# Patient Record
Sex: Female | Born: 1935 | Race: White | Hispanic: No | Marital: Married | Smoking: Never smoker
Health system: Southern US, Community
[De-identification: ages and names within clinical notes are randomized; demographics above are authoritative.]

## PROBLEM LIST (undated history)

## (undated) DIAGNOSIS — M1990 Unspecified osteoarthritis, unspecified site: Secondary | ICD-10-CM

## (undated) DIAGNOSIS — K5900 Constipation, unspecified: Secondary | ICD-10-CM

## (undated) DIAGNOSIS — N289 Disorder of kidney and ureter, unspecified: Secondary | ICD-10-CM

## (undated) DIAGNOSIS — F0390 Unspecified dementia without behavioral disturbance: Secondary | ICD-10-CM

## (undated) DIAGNOSIS — R51 Headache: Secondary | ICD-10-CM

## (undated) DIAGNOSIS — R519 Headache, unspecified: Secondary | ICD-10-CM

## (undated) DIAGNOSIS — E559 Vitamin D deficiency, unspecified: Secondary | ICD-10-CM

## (undated) HISTORY — PX: ABDOMINAL HYSTERECTOMY: SHX81

---

## 2010-08-10 ENCOUNTER — Other Ambulatory Visit: Payer: Self-pay | Admitting: Internal Medicine

## 2010-08-10 DIAGNOSIS — Z1231 Encounter for screening mammogram for malignant neoplasm of breast: Secondary | ICD-10-CM

## 2010-08-18 ENCOUNTER — Ambulatory Visit
Admission: RE | Admit: 2010-08-18 | Discharge: 2010-08-18 | Disposition: A | Discharge status: Home / Self Care | Payer: Medicare Other | Source: Ambulatory Visit | Attending: Internal Medicine | Admitting: Internal Medicine

## 2010-08-18 DIAGNOSIS — Z1231 Encounter for screening mammogram for malignant neoplasm of breast: Secondary | ICD-10-CM

## 2011-04-25 ENCOUNTER — Emergency Department (HOSPITAL_COMMUNITY)
Admission: EM | Admit: 2011-04-25 | Discharge: 2011-04-25 | Disposition: A | Discharge status: Home / Self Care | Payer: Medicare Other | Attending: Emergency Medicine | Admitting: Emergency Medicine

## 2011-04-25 ENCOUNTER — Encounter (HOSPITAL_COMMUNITY): Payer: Self-pay | Admitting: *Deleted

## 2011-04-25 DIAGNOSIS — R4182 Altered mental status, unspecified: Secondary | ICD-10-CM | POA: Insufficient documentation

## 2011-04-25 DIAGNOSIS — F0390 Unspecified dementia without behavioral disturbance: Secondary | ICD-10-CM | POA: Insufficient documentation

## 2011-04-25 NOTE — ED Notes (Signed)
Pt in with [employer]. Found at [facility] on [address]. When at triage window, pt gave 2-3 different names, stated her birthday was "[birth year] No ETOH noted, pt denies any meds today. Ambulatory, alert. [employer] reports no Silver Alerts that match pts description at this time. Pt taken to Tr2 due to AMS.

## 2011-04-25 NOTE — ED Provider Notes (Signed)
History     CSN: [id number]  Arrival date & time 04/25/11  1758   First MD Initiated Contact with Patient 04/25/11 1818      Chief Complaint  Patient presents with  . Altered Mental Status    (Consider location/radiation/quality/duration/timing/severity/associated sxs/prior treatment) HPI This patient presents with yet gpd after being found in an office building downtown.  The patient denies any complaints, but is incapable of providing her birthday, today.  Stay, her home, the contact information for any relatives or any useful information.  She denies any pain, notes that she feels fine History reviewed. No pertinent past medical history.  History reviewed. No pertinent past surgical history.  History reviewed. No pertinent family history.  History  Substance Use Topics  . Smoking status: Not on file  . Smokeless tobacco: Not on file  . Alcohol Use: Not on file    OB History    Grav Para Term Preterm Abortions TAB SAB Ect Mult Living                  Review of Systems  Unable to perform ROS: Dementia    Allergies  Review of patient's allergies indicates not on file.  Home Medications  No current outpatient prescriptions on file.  BP 131/69  Pulse 81  Temp(Src) 98.6 F (37 C) (Oral)  Resp 18  SpO2 99%  Physical Exam  Nursing note and vitals reviewed. Constitutional: She appears well-developed and well-nourished. No distress.  HENT:  Head: Normocephalic and atraumatic.  Eyes: Conjunctivae and EOM are normal.  Cardiovascular: Normal rate and regular rhythm.   Pulmonary/Chest: Effort normal and breath sounds normal. No stridor. No respiratory distress.  Abdominal: She exhibits no distension.  Musculoskeletal: She exhibits no edema.  Neurological: She is alert. No cranial nerve deficit. She exhibits normal muscle tone. Gait normal.  Skin: Skin is warm and dry.  Psychiatric: She has a normal mood and affect. Her behavior is normal. Cognition and memory are  impaired. She expresses impulsivity.    ED Course  Procedures (including critical care time)  Labs Reviewed - No data to display No results found.   No diagnosis found.    MDM  This elderly patient presents via the police after being found wandering on exam she is in no distress, is well-appearing, appropriately closed and with seemingly good hygiene.  The patient denies any complaints.  Soon after the patient's arrival, and with the assistance of the police department, the patient's daughter arrived to the emergency department.  The patient's daughter note that the patient is in her usual state of health, has a history of Alzheimer's dementia, and is currently being evaluated for placement at a facility.  Given this history, the patient.  Absence of distress or notable physical exam findings.  She is appropriate for discharge        Gerhard Munch, MD 04/25/11 1858

## 2011-04-25 NOTE — ED Notes (Signed)
MD Jeraldine Loots notified of pt status, MD to bedside

## 2011-04-25 NOTE — ED Notes (Signed)
Pt in with [employer], pt was found downtown, pt walked into an office there, pt is confused, unable to give name, birthday or any identifying information, unknown if this is patients normal mental status- per officer someone at office where patient was found stated that she was dropped off outside. Pt clothing and appearance are appropriate. Bleeding noted to pt right thumb from unknown injury.

## 2011-04-25 NOTE — ED Notes (Signed)
Pt daughter to bedside, verified that patient mental status is normal for her, MD Jeraldine Loots notified and to bedside to meet with family. Daughter states they are working on placement for patient and that they had been looking for patient.

## 2011-05-17 ENCOUNTER — Ambulatory Visit: Payer: Self-pay | Admitting: Family Medicine

## 2011-05-18 ENCOUNTER — Ambulatory Visit (INDEPENDENT_AMBULATORY_CARE_PROVIDER_SITE_OTHER): Payer: Medicare Other | Admitting: Family Medicine

## 2011-05-18 ENCOUNTER — Encounter: Payer: Self-pay | Admitting: Family Medicine

## 2011-05-18 VITALS — BP 100/70 | HR 100 | Temp 98.4°F | Ht 62.5 in | Wt 94.0 lb

## 2011-05-18 DIAGNOSIS — R634 Abnormal weight loss: Secondary | ICD-10-CM

## 2011-05-18 DIAGNOSIS — Z79899 Other long term (current) drug therapy: Secondary | ICD-10-CM

## 2011-05-18 DIAGNOSIS — Z111 Encounter for screening for respiratory tuberculosis: Secondary | ICD-10-CM

## 2011-05-18 DIAGNOSIS — N390 Urinary tract infection, site not specified: Secondary | ICD-10-CM

## 2011-05-18 DIAGNOSIS — R5383 Other fatigue: Secondary | ICD-10-CM

## 2011-05-18 DIAGNOSIS — R05 Cough: Secondary | ICD-10-CM

## 2011-05-18 DIAGNOSIS — F0390 Unspecified dementia without behavioral disturbance: Secondary | ICD-10-CM

## 2011-05-18 LAB — CBC WITH DIFFERENTIAL/PLATELET
Basophils Absolute: 0 10*3/uL (ref 0.0–0.1)
Basophils Relative: 0 % (ref 0–1)
Eosinophils Absolute: 0 10*3/uL (ref 0.0–0.7)
Hemoglobin: 12.6 g/dL (ref 12.0–15.0)
MCH: 34.1 pg — ABNORMAL HIGH (ref 26.0–34.0)
MCHC: 33.2 g/dL (ref 30.0–36.0)
Neutro Abs: 2.7 10*3/uL (ref 1.7–7.7)
Neutrophils Relative %: 67 % (ref 43–77)
Platelets: 297 10*3/uL (ref 150–400)

## 2011-05-18 MED ORDER — SULFAMETHOXAZOLE-TMP DS 800-160 MG PO TABS: 1.0000 | ORAL_TABLET | Freq: Two times a day (BID) | ORAL | Status: AC

## 2011-05-18 NOTE — Patient Instructions (Addendum)
Use one half Ambien per night. Take all the antibiotic and make sure she gets plenty of fluids. Stop taking the fluoxetine. Recheck here in 2 weeks.

## 2011-05-18 NOTE — Progress Notes (Signed)
  Subjective:    Patient ID: Susan Leon, female    DOB: Feb 06, 1933, 78 y.o.   MRN: [id number]  HPI She is here as a new patient for evaluation of possible UTI. She has been having a strong odor in her urine for a month as well as increased confusion. 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 She also apparently has had difficulty with chronic cough and presently is on a new cough suppressant as well as Tessalon. She has an underlying history of headaches and was given Topamax for that. Also apparently over the last 2 or 3 months her husband states that she has lost approximately 30 pounds.. She does have sporadic nausea but no vomiting or change in stool but apparently also has been having difficulty with fatigue. Her medications were reviewed.   Review of Systems     Objective:   Physical Exam Awake and in no distress with a blank stare and very flat affect area she does answer questions but not always correctly. Tympanic membranes and canals are normal. Throat is clear. Tonsils are normal. Neck is supple without adenopathy or thyromegaly. Cardiac exam shows a regular sinus rhythm without murmurs or gallops. Lungs are clear to auscultation. Abdominal exam shows no masses or tenderness. Reflexes are 1+ EKG shows probable IVCD as well as poor R wave progression and nonspecific ST-T changes.    Assessment & Plan:   1. UTI (lower urinary tract infection)  CBC with Differential, CULTURE, URINE COMPREHENSIVE  2. Dementia  CBC with Differential, Comprehensive metabolic panel, Magnesium  3. Encounter for long-term (current) use of other medications  CBC with Differential, Comprehensive metabolic panel, TB Skin Test, PR ELECTROCARDIOGRAM, COMPLETE  4. Chronic cough    5. Weight loss    6   Headache This is a very complicated case and we will need to get old records on her. We will need to pursue the weight loss. This could all be medication related and as such I will  stop her fluoxetine and will have the caregiver reduce Ambien to 5 mg.

## 2011-05-19 LAB — COMPREHENSIVE METABOLIC PANEL
ALT: 13 U/L (ref 0–35)
AST: 20 U/L (ref 0–37)
Albumin: 3.9 g/dL (ref 3.5–5.2)
Alkaline Phosphatase: 78 U/L (ref 39–117)
BUN: 14 mg/dL (ref 6–23)
CO2: 21 mEq/L (ref 19–32)
Calcium: 9.4 mg/dL (ref 8.4–10.5)
Chloride: 106 mEq/L (ref 96–112)
Creat: 1.02 mg/dL (ref 0.50–1.10)
Glucose, Bld: 124 mg/dL — ABNORMAL HIGH (ref 70–99)
Potassium: 3.6 mEq/L (ref 3.5–5.3)
Sodium: 142 mEq/L (ref 135–145)
Total Bilirubin: 0.4 mg/dL (ref 0.3–1.2)
Total Protein: 6.1 g/dL (ref 6.0–8.3)

## 2011-05-21 ENCOUNTER — Ambulatory Visit (INDEPENDENT_AMBULATORY_CARE_PROVIDER_SITE_OTHER): Payer: Medicare Other | Admitting: Family Medicine

## 2011-05-21 DIAGNOSIS — R05 Cough: Secondary | ICD-10-CM

## 2011-05-21 DIAGNOSIS — R634 Abnormal weight loss: Secondary | ICD-10-CM | POA: Insufficient documentation

## 2011-05-21 LAB — CULTURE, URINE COMPREHENSIVE: Colony Count: >=100000

## 2011-05-21 NOTE — Progress Notes (Signed)
  Subjective:    Patient ID: Susan Leon, female    DOB: January 18, 1933, 78 y.o.   MRN: [id number]  HPI She is here for a followup visit and to have the FL2 form filled out. Her PPD skin test his negative. She is here with her husband and caregiver. Recent blood work was entirely negative. She apparently has had a rather significant weight loss in the last several months. The urine the process of getting her admitted to 8 memory care unit.   Review of Systems     Objective:   Physical Exam Alert and in no distress but oriented to person. She has a rather blank stare.       Assessment & Plan:   1. Chronic cough   2. Weight loss   3   dementia Refer to GI to further evaluate her weight loss. Suspect this could be medication related but would like the input from GI.

## 2011-05-23 ENCOUNTER — Ambulatory Visit (INDEPENDENT_AMBULATORY_CARE_PROVIDER_SITE_OTHER): Payer: Medicare Other | Admitting: Family Medicine

## 2011-05-23 ENCOUNTER — Encounter: Payer: Self-pay | Admitting: Family Medicine

## 2011-05-23 VITALS — BP 100/70 | HR 60 | Wt 90.0 lb

## 2011-05-23 DIAGNOSIS — S0993XA Unspecified injury of face, initial encounter: Secondary | ICD-10-CM

## 2011-05-23 DIAGNOSIS — S20219A Contusion of unspecified front wall of thorax, initial encounter: Secondary | ICD-10-CM

## 2011-05-23 NOTE — Patient Instructions (Signed)
Use Tylenol for aches and pains. Call if further trouble

## 2011-05-23 NOTE — Progress Notes (Signed)
  Subjective:    Patient ID: Susan Leon, female    DOB: 1932-09-08, 78 y.o.   MRN: [id number]  HPI She apparently fell yesterday injuring her right orbital area as well as complaining of some left rib pain. She is having no shortness of breath, abdominal pain, nausea, vomiting, change in orientation.  Review of Systems     Objective:   Physical Exam Alert and more responsive than yesterday. Ecchymosis noted to the right lateral eye area. Cornea and conjunctiva are normal. EOMI. No real oozing is noted. Abdominal exam shows no masses or tenderness. She has slight left lower lateral rib tenderness. Lungs clear to auscultation.       Assessment & Plan:   1. Rib contusion   2. Facial trauma    complained to them that I did not think she had any major going on. Discussed the issue of rib fracture in regard to lung damage and possible splenic problems. She is having no difficulty with either of these. I will treat her symptomatically.

## 2011-05-29 ENCOUNTER — Telehealth: Payer: Self-pay | Admitting: Internal Medicine

## 2011-05-29 NOTE — Telephone Encounter (Signed)
What we sent was a list of med's so I believe they just need hard copy's faxed to them and Ambien is 5 mg in chart and papers we faxed [facility]

## 2011-05-29 NOTE — Telephone Encounter (Signed)
[facility] needs a hard script for ativan and ambien. daughter also said something about you were going to change her ambien dose. Susan Leon has lost alot of weight and [name] has been trying to give her boost. If that is okay they need a written order for boost or you can write a prescription for remoron.

## 2011-05-29 NOTE — Telephone Encounter (Signed)
Check with the nursing home on these meds and make sure the Ambien is 5 mg

## 2011-05-30 NOTE — Telephone Encounter (Signed)
Faxed to company

## 2011-06-01 ENCOUNTER — Other Ambulatory Visit: Payer: Self-pay | Admitting: Family Medicine

## 2011-06-04 ENCOUNTER — Ambulatory Visit: Payer: Medicare Other | Admitting: Gastroenterology

## 2011-06-11 ENCOUNTER — Other Ambulatory Visit: Payer: Self-pay

## 2011-06-11 ENCOUNTER — Encounter (HOSPITAL_COMMUNITY): Payer: Self-pay | Admitting: Physical Medicine and Rehabilitation

## 2011-06-11 ENCOUNTER — Emergency Department (HOSPITAL_COMMUNITY): Payer: Medicare Other

## 2011-06-11 ENCOUNTER — Emergency Department (HOSPITAL_COMMUNITY)
Admission: EM | Admit: 2011-06-11 | Discharge: 2011-06-11 | Disposition: A | Discharge status: Home Health | Payer: Medicare Other | Attending: Emergency Medicine | Admitting: Emergency Medicine

## 2011-06-11 DIAGNOSIS — Z043 Encounter for examination and observation following other accident: Secondary | ICD-10-CM | POA: Insufficient documentation

## 2011-06-11 DIAGNOSIS — W19XXXA Unspecified fall, initial encounter: Secondary | ICD-10-CM | POA: Insufficient documentation

## 2011-06-11 DIAGNOSIS — N949 Unspecified condition associated with female genital organs and menstrual cycle: Secondary | ICD-10-CM | POA: Insufficient documentation

## 2011-06-11 DIAGNOSIS — F0390 Unspecified dementia without behavioral disturbance: Secondary | ICD-10-CM | POA: Insufficient documentation

## 2011-06-11 DIAGNOSIS — Z79899 Other long term (current) drug therapy: Secondary | ICD-10-CM | POA: Insufficient documentation

## 2011-06-11 DIAGNOSIS — R10819 Abdominal tenderness, unspecified site: Secondary | ICD-10-CM | POA: Insufficient documentation

## 2011-06-11 DIAGNOSIS — R079 Chest pain, unspecified: Secondary | ICD-10-CM | POA: Insufficient documentation

## 2011-06-11 HISTORY — DX: Unspecified dementia, unspecified severity, without behavioral disturbance, psychotic disturbance, mood disturbance, and anxiety: F03.90

## 2011-06-11 LAB — DIFFERENTIAL
Basophils Absolute: 0 10*3/uL (ref 0.0–0.1)
Basophils Relative: 0 % (ref 0–1)
Eosinophils Relative: 2 % (ref 0–5)
Monocytes Absolute: 0.6 10*3/uL (ref 0.1–1.0)
Neutro Abs: 2.4 10*3/uL (ref 1.7–7.7)

## 2011-06-11 LAB — URINALYSIS, ROUTINE W REFLEX MICROSCOPIC
Bilirubin Urine: NEGATIVE
Hgb urine dipstick: NEGATIVE
Protein, ur: NEGATIVE mg/dL
Urobilinogen, UA: 1 mg/dL (ref 0.0–1.0)

## 2011-06-11 LAB — URINE MICROSCOPIC-ADD ON

## 2011-06-11 LAB — COMPREHENSIVE METABOLIC PANEL
Alkaline Phosphatase: 157 U/L — ABNORMAL HIGH (ref 39–117)
BUN: 17 mg/dL (ref 6–23)
Calcium: 8.9 mg/dL (ref 8.4–10.5)
GFR calc Af Amer: 72 mL/min — ABNORMAL LOW (ref >90)
Glucose, Bld: 94 mg/dL (ref 70–99)
Total Protein: 6.5 g/dL (ref 6.0–8.3)

## 2011-06-11 LAB — CBC
HCT: 35.1 % — ABNORMAL LOW (ref 36.0–46.0)
MCHC: 33.9 g/dL (ref 30.0–36.0)
MCV: 101.2 fL — ABNORMAL HIGH (ref 78.0–100.0)
RDW: 13.5 % (ref 11.5–15.5)

## 2011-06-11 MED ORDER — IOHEXOL 300 MG/ML  SOLN
80.0000 mL | Freq: Once | INTRAMUSCULAR | Status: AC | PRN
  Administered 2011-06-11: 80 mL via INTRAVENOUS

## 2011-06-11 NOTE — ED Provider Notes (Signed)
History     CSN: [id number]  Arrival date & time 06/11/11  1658   First MD Initiated Contact with Patient 06/11/11 1659      Chief Complaint  Patient presents with  . Fall    (Consider location/radiation/quality/duration/timing/severity/associated sxs/prior treatment) Patient is a 78 y.o. female presenting with fall. The history is provided by the nursing home.  Fall The accident occurred 1 to 2 hours ago. The fall occurred while walking. Distance fallen: from standing. She landed on a hard floor. There was no blood loss. Point of impact: unknown. patient denies pain.  Pain location: patient denies. The patient is experiencing no pain. She was ambulatory at the scene. There was no entrapment after the fall. There was no alcohol use involved in the accident. Pertinent negatives include no fever, no nausea and no vomiting. Associated symptoms comments: Unclear if LOC. Was found conscious. ROS per nursing facility staff. . Exacerbated by: nothing. Treatment on scene includes a c-collar and a backboard. She has tried nothing for the symptoms.    Past Medical History  Diagnosis Date  . Dementia     History reviewed. No pertinent past surgical history.  History reviewed. No pertinent family history.  History  Substance Use Topics  . Smoking status: Never Smoker   . Smokeless tobacco: Never Used  . Alcohol Use: No    OB History    Grav Para Term Preterm Abortions TAB SAB Ect Mult Living                  Review of Systems  Unable to perform ROS: Dementia  Constitutional: Negative for fever.  Gastrointestinal: Negative for nausea and vomiting.    Allergies  Review of patient's allergies indicates no known allergies.  Home Medications   Current Outpatient Rx  Name Route Sig Dispense Refill  . BENZONATATE 200 MG PO CAPS Oral Take 200 mg by mouth 2 (two) times daily as needed.    Marland Kitchen DEXTROMETHORPHAN-QUINIDINE 20-10 MG PO CAPS Oral Take 1 capsule by mouth.    . FLUOXETINE  HCL 20 MG PO TABS Oral Take 20 mg by mouth daily.    . TOPIRAMATE 100 MG PO TABS Oral Take 100 mg by mouth 2 (two) times daily.    Marland Kitchen ZOLPIDEM TARTRATE 10 MG PO TABS Oral Take 10 mg by mouth at bedtime as needed.      BP 117/53  Pulse 80  Temp(Src) 98.6 F (37 C) (Oral)  Resp 18  SpO2 98%  Physical Exam  Constitutional: She appears well-developed. No distress.       Elderly, thin, frail   HENT:  Head: Normocephalic and atraumatic.  Mouth/Throat: Oropharynx is clear and moist.  Eyes: EOM are normal. Pupils are equal, round, and reactive to light.  Neck:       No TTP of midline cervical spine.   Cardiovascular: Normal rate, regular rhythm and normal heart sounds.  Exam reveals no friction rub.   No murmur heard. Pulmonary/Chest: Effort normal and breath sounds normal. No respiratory distress. She has no wheezes. She has no rales.  Abdominal: Soft. There is tenderness. There is guarding. There is no rebound.       Patient appears to have a mild amount of involuntary guarding on examination of mid and lower abdomen.   Musculoskeletal: Normal range of motion. She exhibits tenderness. She exhibits no edema.       No TTP or signs of trauma over chest. TTP over pubic symphisis. Full passive ROM  of both hips with flex/ext, abd/adduction without pain or limitation.   Lymphadenopathy:    She has no cervical adenopathy.  Neurological: She is alert.       Oriented to person. Follows simple commands. Gives inappropriate answers to questions. Moves all extremities spontaneously.   Skin: Skin is warm and dry. No rash noted.  Psychiatric: She has a normal mood and affect. Her behavior is normal.    ED Course  Procedures (including critical care time)  Results for orders placed during the hospital encounter of 06/11/11  CBC      Component Value Range   WBC 5.0  4.0 - 10.5 (K/uL)   RBC 3.47 (*) 3.87 - 5.11 (MIL/uL)   Hemoglobin 11.9 (*) 12.0 - 15.0 (g/dL)   HCT 40.9 (*) 81.1 - 46.0 (%)    MCV 101.2 (*) 78.0 - 100.0 (fL)   MCH 34.3 (*) 26.0 - 34.0 (pg)   MCHC 33.9  30.0 - 36.0 (g/dL)   RDW 91.4  78.2 - 95.6 (%)   Platelets 277  150 - 400 (K/uL)  DIFFERENTIAL      Component Value Range   Neutrophils Relative 48  43 - 77 (%)   Neutro Abs 2.4  1.7 - 7.7 (K/uL)   Lymphocytes Relative 38  12 - 46 (%)   Lymphs Abs 1.9  0.7 - 4.0 (K/uL)   Monocytes Relative 12  3 - 12 (%)   Monocytes Absolute 0.6  0.1 - 1.0 (K/uL)   Eosinophils Relative 2  0 - 5 (%)   Eosinophils Absolute 0.1  0.0 - 0.7 (K/uL)   Basophils Relative 0  0 - 1 (%)   Basophils Absolute 0.0  0.0 - 0.1 (K/uL)  TROPONIN I      Component Value Range   Troponin I <0.30  <0.30 (ng/mL)  URINALYSIS, ROUTINE W REFLEX MICROSCOPIC      Component Value Range   Color, Urine YELLOW  YELLOW    APPearance HAZY (*) CLEAR    Specific Gravity, Urine 1.016  1.005 - 1.030    pH 7.5  5.0 - 8.0    Glucose, UA NEGATIVE  NEGATIVE (mg/dL)   Hgb urine dipstick NEGATIVE  NEGATIVE    Bilirubin Urine NEGATIVE  NEGATIVE    Ketones, ur NEGATIVE  NEGATIVE (mg/dL)   Protein, ur NEGATIVE  NEGATIVE (mg/dL)   Urobilinogen, UA 1.0  0.0 - 1.0 (mg/dL)   Nitrite NEGATIVE  NEGATIVE    Leukocytes, UA SMALL (*) NEGATIVE   COMPREHENSIVE METABOLIC PANEL      Component Value Range   Sodium 138  135 - 145 (mEq/L)   Potassium 3.4 (*) 3.5 - 5.1 (mEq/L)   Chloride 106  96 - 112 (mEq/L)   CO2 22  19 - 32 (mEq/L)   Glucose, Bld 94  70 - 99 (mg/dL)   BUN 17  6 - 23 (mg/dL)   Creatinine, Ser 2.13  0.50 - 1.10 (mg/dL)   Calcium 8.9  8.4 - 08.6 (mg/dL)   Total Protein 6.5  6.0 - 8.3 (g/dL)   Albumin 3.6  3.5 - 5.2 (g/dL)   AST 19  0 - 37 (U/L)   ALT 13  0 - 35 (U/L)   Alkaline Phosphatase 157 (*) 39 - 117 (U/L)   Total Bilirubin 0.2 (*) 0.3 - 1.2 (mg/dL)   GFR calc non Af Amer 62 (*) >90 (mL/min)   GFR calc Af Amer 72 (*) >90 (mL/min)  URINE MICROSCOPIC-ADD ON  Component Value Range   Squamous Epithelial / LPF FEW (*) RARE    WBC, UA 0-2  <3  (WBC/hpf)   Bacteria, UA FEW (*) RARE    Urine-Other AMORPHOUS URATES/PHOSPHATES      Dg Chest 2 View  06/11/2011  *RADIOLOGY REPORT*  Clinical Data: Chest pain  CHEST - 2 VIEW  Comparison: None.  Findings: Cardiomediastinal silhouette is unremarkable.  No acute infiltrate or pleural effusion.  No pulmonary edema.  There is moderate compression deformity mid lumbar spine of indeterminate age.  Clinical correlation is necessary.  IMPRESSION: No acute infiltrate or pleural effusion.  No pulmonary edema. There is moderate compression deformity mid lumbar spine of indeterminate age.  Clinical correlation is necessary.  Original Report Authenticated By: Natasha Mead, M.D.   Dg Pelvis 1-2 Views  06/11/2011  *RADIOLOGY REPORT*  Clinical Data: Fall, pain  PELVIS - 1-2 VIEW  Comparison: None.  Findings: Pessary noted.  Bones appear osteopenic.  Curvature of the spine may be positional or scoliosis.  Sacrum, pelvis and hips appear intact and symmetric.  No diastasis.  IMPRESSION: No acute osseous finding by plain radiography  Original Report Authenticated By: Judie Petit. Ruel Favors, M.D.   Ct Head Wo Contrast  06/11/2011  *RADIOLOGY REPORT*  Clinical Data: Fall  CT HEAD WITHOUT CONTRAST,CT CERVICAL SPINE WITHOUT CONTRAST  Technique:  Contiguous axial images were obtained from the base of the skull through the vertex without contrast.,Technique: Multidetector CT imaging of the cervical spine was performed. Multiplanar CT image reconstructions were also generated.  Comparison: None.  Findings: No skull fracture is noted.  There is mucosal thickening bilateral sphenoid sinus.  Mastoid air cells are unremarkable. Mild cerebral atrophy.  No intracranial hemorrhage, mass effect or midline shift. Periventricular and patchy subcortical white matter decreased attenuation is probable due to chronic small vessel ischemic changes.  No definite acute cortical infarction.  No mass lesion is noted on this unenhanced scan.  IMPRESSION: No  acute intracranial abnormality. Mild cerebral atrophy. Periventricular and patchy subcortical white matter decreased attenuation is probable due to chronic small vessel ischemic changes.  CT cervical spine without IV contrast findings:  Axial images of the cervical spine shows no acute fracture or subluxation.  Computer processed images shows no acute fracture. There is mild about 2 mm anterior subluxation C3 on C4 vertebral body.  This is probable chronic in nature.  Mild anterior spurring noted at C3-C4 C4-C5 C5-C6 level.  There is mild disc space flattening at C3-C4 C4-C5 and C5-C6 level.  Mild disc space flattening with posterior spurring noted at C6-C7 level.  No prevertebral soft tissue swelling.  Mild posterior spurring noted at C4-C5 and C5-C6 level.  Degenerative changes are noted C1-C2 articulation.  Cervical airway is patent.  Impression: 1.  No acute fracture.  Probable chronic mild anterior subluxation C3 on C4 vertebral body.  Multilevel degenerative changes most significant at C3-C4 C4-C5 and C5-C6 level.  No prevertebral soft tissue swelling.  Original Report Authenticated By: Natasha Mead, M.D.   Ct Cervical Spine Wo Contrast  06/11/2011  *RADIOLOGY REPORT*  Clinical Data: Fall  CT HEAD WITHOUT CONTRAST,CT CERVICAL SPINE WITHOUT CONTRAST  Technique:  Contiguous axial images were obtained from the base of the skull through the vertex without contrast.,Technique: Multidetector CT imaging of the cervical spine was performed. Multiplanar CT image reconstructions were also generated.  Comparison: None.  Findings: No skull fracture is noted.  There is mucosal thickening bilateral sphenoid sinus.  Mastoid air cells are unremarkable. Mild  cerebral atrophy.  No intracranial hemorrhage, mass effect or midline shift. Periventricular and patchy subcortical white matter decreased attenuation is probable due to chronic small vessel ischemic changes.  No definite acute cortical infarction.  No mass lesion is noted  on this unenhanced scan.  IMPRESSION: No acute intracranial abnormality. Mild cerebral atrophy. Periventricular and patchy subcortical white matter decreased attenuation is probable due to chronic small vessel ischemic changes.  CT cervical spine without IV contrast findings:  Axial images of the cervical spine shows no acute fracture or subluxation.  Computer processed images shows no acute fracture. There is mild about 2 mm anterior subluxation C3 on C4 vertebral body.  This is probable chronic in nature.  Mild anterior spurring noted at C3-C4 C4-C5 C5-C6 level.  There is mild disc space flattening at C3-C4 C4-C5 and C5-C6 level.  Mild disc space flattening with posterior spurring noted at C6-C7 level.  No prevertebral soft tissue swelling.  Mild posterior spurring noted at C4-C5 and C5-C6 level.  Degenerative changes are noted C1-C2 articulation.  Cervical airway is patent.  Impression: 1.  No acute fracture.  Probable chronic mild anterior subluxation C3 on C4 vertebral body.  Multilevel degenerative changes most significant at C3-C4 C4-C5 and C5-C6 level.  No prevertebral soft tissue swelling.  Original Report Authenticated By: Natasha Mead, M.D.   Ct Abdomen Pelvis W Contrast  06/11/2011  *RADIOLOGY REPORT*  Clinical Data: Fall  CT ABDOMEN AND PELVIS WITH CONTRAST  Technique:  Multidetector CT imaging of the abdomen and pelvis was performed following the standard protocol during bolus administration of intravenous contrast.  Contrast: 80mL OMNIPAQUE IOHEXOL 300 MG/ML IJ SOLN  Comparison: 06/11/2011 radiograph  Findings: Respiratory motion degrades evaluation of the lung bases. Limited images through the lung bases demonstrate no significant appreciable abnormality. The heart size is within normal limits. No pleural or pericardial effusion.  Unremarkable liver status post cholecystectomy.  Mild prominence of the CBD, measuring up to 8 mm.  Tapers smoothly to the level of the ampulla.  Unremarkable spleen,  pancreas, adrenal glands. Duplicated collecting system on the left.  Otherwise symmetric renal enhancement.  No bowel obstruction.  No CT evidence for colitis.  Appendix not identified however no right lower quadrant inflammation.  Pelvic floor laxity.  Pessary in place.  Absent uterus.  No adnexal mass. Decompressed bladder.  Normal caliber vasculature.  Mild atherosclerotic disease.  Osteopenia and multilevel degenerative change.  There is rightward curvature of the thoracolumbar spine.  L1 compression fracture with mild narrowing of the central canal secondary to retropulsion.  IMPRESSION: No acute intra-abdominal process identified.  L1 compression fracture with 50% height loss and mild central canal narrowing secondary to retropulsion.  Correlate with point tenderness.  Original Report Authenticated By: Waneta Martins, M.D.     1. Fall       MDM  73:38 PM 78 year old female with a history of dementia presenting from her skilled nursing facility with a fall. Nursing states that she was found down in her room and the fall was unwitnessed. She was seen normal roughly 15 minutes prior to the fall. Nursing staff state initially she would not respond to them, but when EMS arrived she would respond to them. Prior to the fall she had been at her baseline which is oriented to person, but otherwise confused. She's had no recent fever, cough, vomiting, abdominal pain or diarrhea. Patient is oriented to person but is otherwise confused and gives inappropriate responses which appears to be her baseline. She  has mild tenderness of the pubic symphysis and appears to be mildly tender abdominal exam. Will check CBC, BMP, urinalysis, CT of the head and cervical spine with plain film of the chest and pelvis. We'll also check CT of the abdomen and pelvis given abdominal tenderness on exam.  Labs are without significant abnormality. Urinalysis is clean. CT head shows chronic change but is otherwise unremarkable.  Remainder of imaging is negative for trauma. CT of the abdomen and pelvis reveals an absent gallbladder but a mildly dilated common bile duct. This is likely incidental and of no significance. The husband was made aware to make sure this is followed up given her alkaline phosphatase was mildly elevated. Patient was noted to ambulate without difficulty. Given that imaging is negative for trauma, labs without significant abnormality and she is at her baseline mental and physical functioning there is no indication for admission at this point. Plan was discussed with the husband and she will follow up with her primary physician for reevaluation. He was understanding and agreement. Patient and husband were given strong return precautions and discharged home in stable condition.      Sheran Luz, MD 06/12/11 [id number]

## 2011-06-11 NOTE — ED Notes (Signed)
Called CT b/c pt has not rcvd contrast.

## 2011-06-11 NOTE — ED Notes (Signed)
Pt presents to department via EMS from [facility]. Facility staff found her lying on the floor. Pt awake and alert per normal neuro baseline, has dementia. R leg rotation and tenderness noted. No obvious deformity noted. CBG 84. 20g LAC.

## 2011-06-11 NOTE — ED Provider Notes (Signed)
I saw and evaluated the patient, reviewed the resident's note and I agree with the findings and plan.  The patient has significant dementia. She was found on the ground at her living facility but is unable to tell us what occurred..  during my exam she denies any particular complaints. She is asking to get up and go to the bathroom.   On my exam there is no area significant tenderness. She is able to ambulate around the emergency department. We will assess for any injuries or any acute medical conditions that might predispose towards a syncopal event  Celene Kras, MD 06/11/11 [phone]

## 2011-06-11 NOTE — ED Notes (Signed)
Pt returned to exam room from x-ray. In and Out cath performed to obtain urine sample. Pt tolerated without difficulty. Husband at the bedside. Vital signs stable. Pt is alert, but remains confused.

## 2011-06-11 NOTE — ED Notes (Signed)
Pt presents to department for evaluation of fall. Was found lying in the floor by staff members. Pt has dementia and is unable to recall events. She is alert, but unable to answer questions appropriately. No obvious deformities noted. No abrasion or bruising noted. Able to move all extremities. No rotation noted to legs. Removed from LSB by Dr. Fayrene Fearing. c-collar remains in place.

## 2011-06-11 NOTE — ED Notes (Signed)
Patient transported to CT 

## 2011-07-20 ENCOUNTER — Ambulatory Visit (INDEPENDENT_AMBULATORY_CARE_PROVIDER_SITE_OTHER): Payer: Medicare Other | Admitting: Family Medicine

## 2011-07-20 ENCOUNTER — Encounter: Payer: Self-pay | Admitting: Family Medicine

## 2011-07-20 VITALS — BP 130/70 | HR 83 | Wt 91.0 lb

## 2011-07-20 DIAGNOSIS — F0390 Unspecified dementia without behavioral disturbance: Secondary | ICD-10-CM

## 2011-07-20 DIAGNOSIS — R358 Other polyuria: Secondary | ICD-10-CM

## 2011-07-20 LAB — POCT URINALYSIS DIPSTICK
Bilirubin, UA: NEGATIVE
Nitrite, UA: NEGATIVE
pH, UA: 5

## 2011-07-20 MED ORDER — PAROXETINE HCL 10 MG PO TABS: 10.0000 mg | ORAL_TABLET | ORAL | Status: DC

## 2011-07-20 NOTE — Progress Notes (Signed)
  Subjective:    Patient ID: Susan Leon, female    DOB: [birth year], 78 y.o.   MRN: [id number]  HPI She is here for an evaluation. She is not sure why she is here. She does talk but does not answer questions appropriately. Her husband is with her. Apparently there was some question about bladder infection from the care unit she is staying in. There is also question concerning her emotional lability.   Review of Systems     Objective:   Physical Exam Alert and in no distress but definitely not oriented. Urine microscopic was equivocal.       Assessment & Plan:   1. Polyuria  POCT Urinalysis Dipstick, CULTURE, URINE COMPREHENSIVE  2. Dementia     I will check the culture concerning her urine. Also stop Prozac and switch her to Paxil to see if that will help. Check here in one month.

## 2011-07-23 ENCOUNTER — Other Ambulatory Visit: Payer: Self-pay

## 2011-07-23 LAB — CULTURE, URINE COMPREHENSIVE

## 2011-07-23 MED ORDER — SULFAMETHOXAZOLE-TRIMETHOPRIM 400-80 MG PO TABS: 1.0000 | ORAL_TABLET | Freq: Two times a day (BID) | ORAL | Status: DC

## 2011-07-23 NOTE — Telephone Encounter (Signed)
Sent med in per jcl 

## 2011-08-01 ENCOUNTER — Encounter: Payer: Self-pay | Admitting: Family Medicine

## 2011-08-01 ENCOUNTER — Ambulatory Visit (INDEPENDENT_AMBULATORY_CARE_PROVIDER_SITE_OTHER): Payer: Medicare Other | Admitting: Family Medicine

## 2011-08-01 ENCOUNTER — Encounter: Payer: Self-pay | Admitting: Gastroenterology

## 2011-08-01 VITALS — BP 110/60 | HR 93 | Wt 85.0 lb

## 2011-08-01 DIAGNOSIS — R634 Abnormal weight loss: Secondary | ICD-10-CM

## 2011-08-01 DIAGNOSIS — F0390 Unspecified dementia without behavioral disturbance: Secondary | ICD-10-CM

## 2011-08-01 DIAGNOSIS — R627 Adult failure to thrive: Secondary | ICD-10-CM

## 2011-08-01 DIAGNOSIS — R319 Hematuria, unspecified: Secondary | ICD-10-CM

## 2011-08-01 LAB — POCT URINALYSIS DIPSTICK
Bilirubin, UA: NEGATIVE
Glucose, UA: NEGATIVE
Nitrite, UA: NEGATIVE

## 2011-08-01 MED ORDER — PAROXETINE HCL 20 MG PO TABS: 20.0000 mg | ORAL_TABLET | ORAL | Status: DC

## 2011-08-01 NOTE — Progress Notes (Signed)
  Subjective:    Patient ID: Susan Leon, female    DOB: 05-Jun-1932, 78 y.o.   MRN: [id number]  HPI She is here for recheck on her urine. Apparently she is having no urinary symptoms but did have some stool incontinence last night. Review her record indicates she continues to lose weight. She was set up for a GI evaluation however her husband had to cancel it due to her anxiety. She continues to have difficulty with anxiety and has not yet responded to Paxil 10 mg.  Review of Systems     Objective:   Physical Exam Alert and in no distress. She responds to questions inappropriately, giving a response unrelated to the question. Abdominal exam shows no masses or tenderness. Review of her weight indicates about a 10 pound weight loss.     Assessment & Plan:   1. Blood in urine  POCT Urinalysis Dipstick  2. Failure to thrive in adult  Ambulatory referral to Gastroenterology  3. Weight loss  Ambulatory referral to Gastroenterology  4. Dementia  Ambulatory referral to Gastroenterology   I suspect her weight loss is probably more dementia and failure to thrive but will need to make sure there is no GI etiology.

## 2011-08-06 ENCOUNTER — Other Ambulatory Visit: Payer: Medicare Other

## 2011-08-07 ENCOUNTER — Telehealth: Payer: Self-pay

## 2011-08-07 NOTE — Telephone Encounter (Signed)
Open in error

## 2011-08-09 ENCOUNTER — Telehealth: Payer: Self-pay | Admitting: Family Medicine

## 2011-08-10 ENCOUNTER — Encounter: Payer: Self-pay | Admitting: Gastroenterology

## 2011-08-10 ENCOUNTER — Ambulatory Visit (INDEPENDENT_AMBULATORY_CARE_PROVIDER_SITE_OTHER): Payer: Medicare Other | Admitting: Gastroenterology

## 2011-08-10 ENCOUNTER — Other Ambulatory Visit (INDEPENDENT_AMBULATORY_CARE_PROVIDER_SITE_OTHER): Payer: Medicare Other

## 2011-08-10 VITALS — BP 120/70 | HR 80 | Ht 63.0 in | Wt 88.4 lb

## 2011-08-10 DIAGNOSIS — R6889 Other general symptoms and signs: Secondary | ICD-10-CM

## 2011-08-10 DIAGNOSIS — F0280 Dementia in other diseases classified elsewhere without behavioral disturbance: Secondary | ICD-10-CM

## 2011-08-10 DIAGNOSIS — D509 Iron deficiency anemia, unspecified: Secondary | ICD-10-CM

## 2011-08-10 DIAGNOSIS — F068 Other specified mental disorders due to known physiological condition: Secondary | ICD-10-CM

## 2011-08-10 DIAGNOSIS — K5902 Outlet dysfunction constipation: Secondary | ICD-10-CM

## 2011-08-10 DIAGNOSIS — Z9049 Acquired absence of other specified parts of digestive tract: Secondary | ICD-10-CM

## 2011-08-10 DIAGNOSIS — Z9089 Acquired absence of other organs: Secondary | ICD-10-CM

## 2011-08-10 DIAGNOSIS — N390 Urinary tract infection, site not specified: Secondary | ICD-10-CM

## 2011-08-10 DIAGNOSIS — R634 Abnormal weight loss: Secondary | ICD-10-CM

## 2011-08-10 DIAGNOSIS — G309 Alzheimer's disease, unspecified: Secondary | ICD-10-CM

## 2011-08-10 LAB — CBC WITH DIFFERENTIAL/PLATELET
Basophils Absolute: 0 10*3/uL (ref 0.0–0.1)
Basophils Relative: 0.5 % (ref 0.0–3.0)
Eosinophils Absolute: 0 10*3/uL (ref 0.0–0.7)
Lymphocytes Relative: 32.1 % (ref 12.0–46.0)
MCHC: 32.5 g/dL (ref 30.0–36.0)
Neutrophils Relative %: 58.7 % (ref 43.0–77.0)
Platelets: 239 10*3/uL (ref 150.0–400.0)
RBC: 3.37 Mil/uL — ABNORMAL LOW (ref 3.87–5.11)

## 2011-08-10 LAB — FERRITIN: Ferritin: 69.5 ng/mL (ref 10.0–291.0)

## 2011-08-10 LAB — MAGNESIUM: Magnesium: 2.1 mg/dL (ref 1.5–2.5)

## 2011-08-10 LAB — IBC PANEL: Saturation Ratios: 60.8 % — ABNORMAL HIGH (ref 20.0–50.0)

## 2011-08-10 LAB — FOLATE: Folate: 13.2 ng/mL (ref >5.9)

## 2011-08-10 LAB — VITAMIN B12: Vitamin B-12: 301 pg/mL (ref 211–911)

## 2011-08-10 MED ORDER — POLYETHYLENE GLYCOL 3350 17 GM/SCOOP PO POWD: ORAL | Status: DC

## 2011-08-10 NOTE — Progress Notes (Signed)
History of Present Illness:  This is a very nice but unfortunate 78 year old Caucasian female with advancing Alzheimer's disease requiring institutionalization. She is brought to our office today by her husband for evaluation of progressive weight loss over the last 2 years. The patient can really give no reliable history. Apparently she has no appetite, but both patient and the husband denied dysphagia, nausea vomiting, epigastric pain, or any hepatobiliary complaints. There was a question of possible dark melanotic stools, but review of her labs shows no significant anemia, and she had CT scan of the abdomen in March which was entirely normal. Apparently she does have some incontinency of stool and urine. She denies lower bowel pain, and there is no history of fever, chills, or systemic complaints. Patient had colonoscopy in [place] in 2008 which was apparently normal. She has marked agoraphobia and her husband does not think she is a candidate for any procedures at this time. Family history is noncontributory. Review of her labs shows no NSAIDs, and she does not use alcohol or cigarettes. For headache she is on Topamax 100 mg a day, also Prozac 20 mg a day, Bactrim for recurrent urinary tract infections, and dextromethorphan-quinidine 20-10 one tablet a day. She apparently has a chronic cough and uses when necessary Tessalon Perles. Patient is status post cholecystectomy. CT scan showed no evidence of pancreatic mass or dilated biliary structures. She does have a chronic compression fracture of L1.  I have reviewed this patient's present history, medical and surgical past history, allergies and medications.     ROS: The remainder of the 10 point ROS is negative.. recurrent headaches that have been evaluated at the headache Center, mild insomnia, diagnosed previously associated depression, chronic back pain, and urinary incontinency and recurrent urinary tract infections. Despite being on multiple  antibiotics, she's had no symptoms of C. difficile infection. Patient and her husband deny any specific food intolerances.    Physical Exam: Depressed patient with obvious severe memory dysfunction. She is awake and communicative. Blood pressure 110/80, pulse 76 and regular, and weight 94 pounds. General very thin almost cachectic-appearing patient in no acute distress, appearing oldeer her stated age Eyes PERRLA, no icterus, fundoscopic exam per opthamologist Skin no lesions noted Neck supple, no adenopathy, no thyroid enlargement, no tenderness Chest clear to percussion and auscultation Heart no significant murmurs, gallops or rubs noted Abdomen no hepatosplenomegaly masses or tenderness, BS normal.  Rectal inspection normal no fissures, or fistulae noted.  No masses or tenderness on digital exam. Stool guaiac negative. Impaction of very hard stool in the rectal vault which is guaiac negative. Extremities no acute joint lesions, edema, phlebitis or evidence of cellulitis. Psychological mental status normal and normal affect.  Assessment and plan: Advancing dementia probably a candidate for this patient's lack of appetite and weight loss. I have reviewed her labs and CT scan, and really have no great ideas at this point . Ideally, endoscopy would be can schedule, but apparently she is not a candidate for this because of her agoraphobia. She does have evidence of an impaction ,and I will treat her with each bedtime MiraLax, and see how she does with that problem. I also spoke with the husband about possible endoscopic placement of a feeding tube, and he does not feel that that is an option. We will check followup CBC, anemia profile, amylase, lipase, sedimentation rate, and celiac antibodies. Otherwise advised that she continue her medications as listed and reviewed.  Encounter Diagnoses  Name Primary?  . Weight  loss Yes  . Iron deficiency anemia, unspecified    . Other general symptoms

## 2011-08-10 NOTE — Patient Instructions (Signed)
Please go to the basement today for your labs. Take Miralax one capful mixed in 8 ounces of water every night at bedtime.  Take a multivitamin one tablet by mouth every day.

## 2011-08-13 LAB — CELIAC PANEL 10
Endomysial Screen: NEGATIVE
Gliadin IgA: 7.2 U/mL (ref <20)
IgA: 130 mg/dL (ref 69–380)

## 2011-08-14 NOTE — Telephone Encounter (Signed)
TSD  

## 2011-08-15 ENCOUNTER — Telehealth: Payer: Self-pay | Admitting: Family Medicine

## 2011-08-16 ENCOUNTER — Telehealth: Payer: Self-pay

## 2011-08-16 MED ORDER — LORAZEPAM 0.5 MG PO TABS: 0.5000 mg | ORAL_TABLET | Freq: Four times a day (QID) | ORAL | Status: DC | PRN

## 2011-08-16 NOTE — Telephone Encounter (Signed)
Make sure that the Ativan refill gets taking care of

## 2011-08-16 NOTE — Telephone Encounter (Signed)
Called med in per [place] to [facility]

## 2011-08-16 NOTE — Telephone Encounter (Signed)
Husband has call hospice to see if he could get some help with Susan Leon . Vickie from hospice called and wanted to know what you thought if she meets the 6 mth criteria or if she would need palative care she said they would go do an eval if you agree and if you agree their DR. Would assist with management when you were not available please advise

## 2011-08-16 NOTE — Telephone Encounter (Signed)
This is probably not appropriate. Have the husband and wife commenced we can discuss what care he needs. He should be able to get help with the assisted living facility

## 2011-08-17 NOTE — Telephone Encounter (Signed)
Called Susan Leon

## 2011-08-17 NOTE — Telephone Encounter (Signed)
Hospice nurse informed and so was Susan Leon that he didn't think this appropriate

## 2011-08-22 ENCOUNTER — Telehealth: Payer: Self-pay | Admitting: Family Medicine

## 2011-08-22 NOTE — Telephone Encounter (Signed)
Have faxed order according to Vickie at hospice

## 2011-08-22 NOTE — Telephone Encounter (Signed)
Please take care of this.  

## 2011-08-31 ENCOUNTER — Telehealth: Payer: Self-pay | Admitting: Internal Medicine

## 2011-09-05 NOTE — Telephone Encounter (Signed)
sl

## 2011-09-15 ENCOUNTER — Other Ambulatory Visit: Payer: Self-pay | Admitting: Family Medicine

## 2011-09-17 NOTE — Telephone Encounter (Signed)
Paxil renewed 

## 2011-09-17 NOTE — Telephone Encounter (Signed)
Is this ok?

## 2011-10-08 ENCOUNTER — Other Ambulatory Visit: Payer: Self-pay | Admitting: Family Medicine

## 2011-10-08 NOTE — Progress Notes (Signed)
Nuedexta and Tessalon Perles were discontinued due to lack of efficacy

## 2011-10-09 ENCOUNTER — Telehealth: Payer: Self-pay

## 2011-10-09 NOTE — Telephone Encounter (Signed)
med's benzonatate 200 mg and neudexta 20 mg 10 mg have been called and canceled per jcl orders

## 2012-06-01 ENCOUNTER — Emergency Department (HOSPITAL_COMMUNITY)
Admission: EM | Admit: 2012-06-01 | Discharge: 2012-06-01 | Disposition: A | Discharge status: Skilled Nursing Facility | Payer: Medicare Other | Attending: Emergency Medicine | Admitting: Emergency Medicine

## 2012-06-01 ENCOUNTER — Emergency Department (HOSPITAL_COMMUNITY): Payer: Medicare Other

## 2012-06-01 ENCOUNTER — Encounter (HOSPITAL_COMMUNITY): Payer: Self-pay | Admitting: Emergency Medicine

## 2012-06-01 DIAGNOSIS — F0390 Unspecified dementia without behavioral disturbance: Secondary | ICD-10-CM | POA: Insufficient documentation

## 2012-06-01 DIAGNOSIS — S0990XA Unspecified injury of head, initial encounter: Secondary | ICD-10-CM | POA: Insufficient documentation

## 2012-06-01 DIAGNOSIS — S0083XA Contusion of other part of head, initial encounter: Secondary | ICD-10-CM

## 2012-06-01 DIAGNOSIS — X58XXXA Exposure to other specified factors, initial encounter: Secondary | ICD-10-CM | POA: Insufficient documentation

## 2012-06-01 DIAGNOSIS — Y9210 Unspecified residential institution as the place of occurrence of the external cause: Secondary | ICD-10-CM | POA: Insufficient documentation

## 2012-06-01 DIAGNOSIS — S0003XA Contusion of scalp, initial encounter: Secondary | ICD-10-CM | POA: Insufficient documentation

## 2012-06-01 DIAGNOSIS — IMO0002 Reserved for concepts with insufficient information to code with codable children: Secondary | ICD-10-CM | POA: Insufficient documentation

## 2012-06-01 DIAGNOSIS — Y939 Activity, unspecified: Secondary | ICD-10-CM | POA: Insufficient documentation

## 2012-06-01 DIAGNOSIS — Z79899 Other long term (current) drug therapy: Secondary | ICD-10-CM | POA: Insufficient documentation

## 2012-06-01 NOTE — ED Notes (Signed)
Pt discharged to [facility] via [facility].

## 2012-06-01 NOTE — ED Notes (Signed)
Pt presents to Ed via EMS from [facility] with c/o bruising on her left eye. CBG 70. NAD.

## 2012-06-03 NOTE — ED Provider Notes (Signed)
History    79 year old female presenting from nursing home for evaluation after possible fall. Patient was found with bruising to the left side of her face. Patient denies any complaints to me, although she is unreliable historian. No report of vomiting. No blood thinning medications on patient's medication list. No intervention prior to arrival   CSN: [id number]  Arrival date & time 06/01/12  0807   First MD Initiated Contact with Patient 06/01/12 [phone]      Chief Complaint  Patient presents with  . Bleeding/Bruising    (Consider location/radiation/quality/duration/timing/severity/associated sxs/prior treatment) HPI  Past Medical History  Diagnosis Date  . Dementia     Past Surgical History  Procedure Laterality Date  . Abdominal hysterectomy      Family History  Problem Relation Age of Onset  . Breast cancer Sister   . Breast cancer Mother     History  Substance Use Topics  . Smoking status: Never Smoker   . Smokeless tobacco: Never Used  . Alcohol Use: No    OB History   Grav Para Term Preterm Abortions TAB SAB Ect Mult Living                  Review of Systems  Level 5 caveat applies because patient has severe dementia.  Allergies  Ivp dye  Home Medications   Current Outpatient Rx  Name  Route  Sig  Dispense  Refill  . Cholecalciferol 2000 UNITS CAPS   Oral   Take 1 capsule by mouth daily.         Marland Kitchen ibuprofen (ADVIL,MOTRIN) 200 MG tablet   Oral   Take 200 mg by mouth every 6 (six) hours as needed for pain.         Marland Kitchen PARoxetine (PAXIL) 20 MG tablet   Oral   Take 1 tablet (20 mg total) by mouth every morning.   30 tablet   5   . polyethylene glycol powder (MIRALAX) powder      Mix one capful in 8 ounces of water and drink every night   255 g   0   . tiZANidine (ZANAFLEX) 4 MG tablet   Oral   Take 4 mg by mouth every 6 (six) hours as needed.         . topiramate (TOPAMAX) 100 MG tablet   Oral   Take 100 mg by mouth 2 (two)  times daily.         Marland Kitchen LORazepam (ATIVAN) 0.5 MG tablet   Oral   Take 1 tablet (0.5 mg total) by mouth every 6 (six) hours as needed.   21 tablet   5     BP 135/62  Pulse 65  Temp(Src) 98.7 F (37.1 C) (Oral)  SpO2 98%  Physical Exam  Nursing note and vitals reviewed. Constitutional: She appears well-developed and well-nourished. No distress.  HENT:  Left periorbital ecchymosis with tenderness in the inferior left orbit. Small area of abrasion to the right temporal region.  Eyes: Conjunctivae are normal. Right eye exhibits no discharge. Left eye exhibits no discharge.  Neck: Neck supple.  Cardiovascular: Normal rate, regular rhythm and normal heart sounds.  Exam reveals no gallop and no friction rub.   No murmur heard. Pulmonary/Chest: Effort normal and breath sounds normal. No respiratory distress.  Abdominal: Soft. She exhibits no distension. There is no tenderness.  Musculoskeletal: She exhibits no edema and no tenderness.  Skin: Skin is warm and dry.  Psychiatric:  Pleasantly demented  ED Course  Procedures (including critical care time)  Labs Reviewed - No data to display No results found.  Ct Head Wo Contrast  06/01/2012  *RADIOLOGY REPORT*  Clinical Data:    Head and facial injuries.  Left periorbital bruising.  CT HEAD WITHOUT CONTRAST CT MAXILLOFACIAL WITHOUT CONTRAST CT CERVICAL SPINE WITHOUT CONTRAST  Technique:  Multidetector CT imaging of the head, cervical spine, and maxillofacial structures were performed using the standard protocol without intravenous contrast. Multiplanar CT image reconstructions of the cervical spine and maxillofacial structures were also generated.  Comparison:  06/11/2011  CT HEAD  Findings: No acute intracranial abnormality.  Specifically, no hemorrhage, hydrocephalus, mass lesion, acute infarction, or significant intracranial injury.  No acute calvarial abnormality. Visualized paranasal sinuses and mastoids clear.  Orbital soft tissues  unremarkable.  IMPRESSION: No acute intracranial abnormality.  CT MAXILLOFACIAL  Findings:  No evidence of facial orbital fracture.  Zygomatic arches and mandible are intact.  Paranasal sinuses are clear. Orbital soft tissues are unremarkable.  IMPRESSION: No evidence of facial or orbital fracture.  CT CERVICAL SPINE  Findings:   Loss of normal cervical lordosis.  Degenerative disc and facet disease throughout the cervical spine.   Prevertebral soft tissues are normal.  No fracture.  Prevertebral soft tissues are normal.  IMPRESSION: No acute bony abnormality.  Degenerative changes.   Original Report Authenticated By: Charlett Nose, M.D.    Ct Cervical Spine Wo Contrast  06/01/2012  *RADIOLOGY REPORT*  Clinical Data:    Head and facial injuries.  Left periorbital bruising.  CT HEAD WITHOUT CONTRAST CT MAXILLOFACIAL WITHOUT CONTRAST CT CERVICAL SPINE WITHOUT CONTRAST  Technique:  Multidetector CT imaging of the head, cervical spine, and maxillofacial structures were performed using the standard protocol without intravenous contrast. Multiplanar CT image reconstructions of the cervical spine and maxillofacial structures were also generated.  Comparison:  06/11/2011  CT HEAD  Findings: No acute intracranial abnormality.  Specifically, no hemorrhage, hydrocephalus, mass lesion, acute infarction, or significant intracranial injury.  No acute calvarial abnormality. Visualized paranasal sinuses and mastoids clear.  Orbital soft tissues unremarkable.  IMPRESSION: No acute intracranial abnormality.  CT MAXILLOFACIAL  Findings:  No evidence of facial orbital fracture.  Zygomatic arches and mandible are intact.  Paranasal sinuses are clear. Orbital soft tissues are unremarkable.  IMPRESSION: No evidence of facial or orbital fracture.  CT CERVICAL SPINE  Findings:   Loss of normal cervical lordosis.  Degenerative disc and facet disease throughout the cervical spine.   Prevertebral soft tissues are normal.  No fracture.   Prevertebral soft tissues are normal.  IMPRESSION: No acute bony abnormality.  Degenerative changes.   Original Report Authenticated By: Charlett Nose, M.D.    Ct Maxillofacial Wo Cm  06/01/2012  *RADIOLOGY REPORT*  Clinical Data:    Head and facial injuries.  Left periorbital bruising.  CT HEAD WITHOUT CONTRAST CT MAXILLOFACIAL WITHOUT CONTRAST CT CERVICAL SPINE WITHOUT CONTRAST  Technique:  Multidetector CT imaging of the head, cervical spine, and maxillofacial structures were performed using the standard protocol without intravenous contrast. Multiplanar CT image reconstructions of the cervical spine and maxillofacial structures were also generated.  Comparison:  06/11/2011  CT HEAD  Findings: No acute intracranial abnormality.  Specifically, no hemorrhage, hydrocephalus, mass lesion, acute infarction, or significant intracranial injury.  No acute calvarial abnormality. Visualized paranasal sinuses and mastoids clear.  Orbital soft tissues unremarkable.  IMPRESSION: No acute intracranial abnormality.  CT MAXILLOFACIAL  Findings:  No evidence of facial orbital fracture.  Zygomatic arches  and mandible are intact.  Paranasal sinuses are clear. Orbital soft tissues are unremarkable.  IMPRESSION: No evidence of facial or orbital fracture.  CT CERVICAL SPINE  Findings:   Loss of normal cervical lordosis.  Degenerative disc and facet disease throughout the cervical spine.   Prevertebral soft tissues are normal.  No fracture.  Prevertebral soft tissues are normal.  IMPRESSION: No acute bony abnormality.  Degenerative changes.   Original Report Authenticated By: Charlett Nose, M.D.     1. Head injury, initial encounter   2. Facial contusion, initial encounter       MDM  79 year old female with evidence of head trauma after a presumed fall. Patient has a history dementia and is unable to provide much useful history. She seems to be at her described baseline. Imaging of her head and cervical spine without any acute  abnormality. Patient is hemodynamically stable. She is on any blood thinning medication.        Raeford Razor, MD 06/03/12 1958

## 2013-01-19 ENCOUNTER — Ambulatory Visit (INDEPENDENT_AMBULATORY_CARE_PROVIDER_SITE_OTHER): Payer: Medicare Other | Admitting: Family Medicine

## 2013-01-19 ENCOUNTER — Encounter: Payer: Self-pay | Admitting: Family Medicine

## 2013-01-19 VITALS — BP 118/70 | HR 60 | Wt 120.0 lb

## 2013-01-19 DIAGNOSIS — Z79899 Other long term (current) drug therapy: Secondary | ICD-10-CM

## 2013-01-19 DIAGNOSIS — Z23 Encounter for immunization: Secondary | ICD-10-CM

## 2013-01-19 DIAGNOSIS — R627 Adult failure to thrive: Secondary | ICD-10-CM

## 2013-01-19 DIAGNOSIS — F0390 Unspecified dementia without behavioral disturbance: Secondary | ICD-10-CM

## 2013-01-19 DIAGNOSIS — D649 Anemia, unspecified: Secondary | ICD-10-CM

## 2013-01-19 LAB — CBC WITH DIFFERENTIAL/PLATELET
Eosinophils Absolute: 0.1 10*3/uL (ref 0.0–0.7)
Eosinophils Relative: 1 % (ref 0–5)
Hemoglobin: 13 g/dL (ref 12.0–15.0)
Lymphs Abs: 1.7 10*3/uL (ref 0.7–4.0)
MCH: 34 pg (ref 26.0–34.0)
MCHC: 34.5 g/dL (ref 30.0–36.0)
MCV: 98.7 fL (ref 78.0–100.0)
Monocytes Absolute: 0.4 10*3/uL (ref 0.1–1.0)
Monocytes Relative: 8 % (ref 3–12)
RBC: 3.82 MIL/uL — ABNORMAL LOW (ref 3.87–5.11)

## 2013-01-19 LAB — COMPREHENSIVE METABOLIC PANEL
BUN: 18 mg/dL (ref 6–23)
CO2: 27 mEq/L (ref 19–32)
Creat: 0.98 mg/dL (ref 0.50–1.10)
Glucose, Bld: 85 mg/dL (ref 70–99)
Total Bilirubin: 0.5 mg/dL (ref 0.3–1.2)
Total Protein: 6.6 g/dL (ref 6.0–8.3)

## 2013-01-19 NOTE — Progress Notes (Signed)
  Subjective:    Patient ID: Susan Leon, female    DOB: [birth year], 79 y.o.   MRN: [id number]  HPI She is here for medication check. She is now in assisted living at [facility]. Hospice is involved and apparently has been given her medications. Her husband states that she is in a much better state of mind. Much more calm and relaxed. She continues on medications listed in the chart. He does visit her quite often. He states that since she has been in this facility she has actually gained weight. Review of her record indicates she did have a slightly low hemoglobin.   Review of Systems     Objective:   Physical Exam alert and in no distress. Tympanic membranes and canals are normal. Throat is clear. Tonsils are normal. Neck is supple without adenopathy or thyromegaly. Cardiac exam shows a regular sinus rhythm without murmurs or gallops. Lungs are clear to auscultation. Abdominal exam shows decreased bowel sounds without masses or tenderness.       Assessment & Plan:  Need for prophylactic vaccination and inoculation against influenza - Plan: Flu vaccine HIGH DOSE PF (Fluzone Tri High dose)  Need for prophylactic vaccination and inoculation against unspecified single disease - Plan: Pneumococcal conjugate vaccine 13-valent less than 5yo IM, CANCELED: Pneumococcal polysaccharide vaccine 23-valent greater than or equal to 2yo subcutaneous/IM  Anemia - Plan: CBC with Differential  Dementia - Plan: CBC with Differential, Comprehensive metabolic panel  Encounter for long-term (current) use of other medications - Plan: CBC with Differential, Comprehensive metabolic panel  Failure to thrive in adult - Plan: CBC with Differential, Comprehensive metabolic panel  flu shot given with risks and benefits discussed with her husband. Immunizations were updated.

## 2013-05-08 ENCOUNTER — Emergency Department (HOSPITAL_COMMUNITY)
Admission: EM | Admit: 2013-05-08 | Discharge: 2013-05-08 | Disposition: A | Discharge status: Home / Self Care | Payer: Medicare Other | Attending: Emergency Medicine | Admitting: Emergency Medicine

## 2013-05-08 ENCOUNTER — Emergency Department (HOSPITAL_COMMUNITY): Payer: Medicare Other

## 2013-05-08 ENCOUNTER — Encounter (HOSPITAL_COMMUNITY): Payer: Self-pay | Admitting: Emergency Medicine

## 2013-05-08 DIAGNOSIS — N390 Urinary tract infection, site not specified: Secondary | ICD-10-CM | POA: Insufficient documentation

## 2013-05-08 DIAGNOSIS — F0390 Unspecified dementia without behavioral disturbance: Secondary | ICD-10-CM | POA: Insufficient documentation

## 2013-05-08 DIAGNOSIS — Z79899 Other long term (current) drug therapy: Secondary | ICD-10-CM | POA: Insufficient documentation

## 2013-05-08 LAB — COMPREHENSIVE METABOLIC PANEL
ALBUMIN: 4 g/dL (ref 3.5–5.2)
ALT: 16 U/L (ref 0–35)
AST: 26 U/L (ref 0–37)
Alkaline Phosphatase: 113 U/L (ref 39–117)
BILIRUBIN TOTAL: 0.3 mg/dL (ref 0.3–1.2)
BUN: 10 mg/dL (ref 6–23)
CHLORIDE: 100 meq/L (ref 96–112)
CO2: 24 mEq/L (ref 19–32)
CREATININE: 0.72 mg/dL (ref 0.50–1.10)
Calcium: 9 mg/dL (ref 8.4–10.5)
GFR calc Af Amer: >90 mL/min (ref >90)
GFR calc non Af Amer: 80 mL/min — ABNORMAL LOW (ref >90)
Glucose, Bld: 102 mg/dL — ABNORMAL HIGH (ref 70–99)
Potassium: 4.2 mEq/L (ref 3.7–5.3)
Sodium: 140 mEq/L (ref 137–147)
TOTAL PROTEIN: 7.3 g/dL (ref 6.0–8.3)

## 2013-05-08 LAB — URINALYSIS, ROUTINE W REFLEX MICROSCOPIC
Bilirubin Urine: NEGATIVE
GLUCOSE, UA: NEGATIVE mg/dL
Ketones, ur: NEGATIVE mg/dL
LEUKOCYTES UA: NEGATIVE
Nitrite: NEGATIVE
PH: 7 (ref 5.0–8.0)
Protein, ur: NEGATIVE mg/dL
SPECIFIC GRAVITY, URINE: 1.013 (ref 1.005–1.030)
Urobilinogen, UA: 1 mg/dL (ref 0.0–1.0)

## 2013-05-08 LAB — CBC WITH DIFFERENTIAL/PLATELET
BASOS ABS: 0 10*3/uL (ref 0.0–0.1)
BASOS PCT: 0 % (ref 0–1)
EOS ABS: 0.1 10*3/uL (ref 0.0–0.7)
Eosinophils Relative: 1 % (ref 0–5)
HCT: 39.2 % (ref 36.0–46.0)
Hemoglobin: 13.3 g/dL (ref 12.0–15.0)
Lymphocytes Relative: 27 % (ref 12–46)
Lymphs Abs: 1.6 10*3/uL (ref 0.7–4.0)
MCH: 34.6 pg — AB (ref 26.0–34.0)
MCHC: 33.9 g/dL (ref 30.0–36.0)
MCV: 102.1 fL — ABNORMAL HIGH (ref 78.0–100.0)
MONO ABS: 0.7 10*3/uL (ref 0.1–1.0)
MONOS PCT: 12 % (ref 3–12)
NEUTROS ABS: 3.5 10*3/uL (ref 1.7–7.7)
NEUTROS PCT: 60 % (ref 43–77)
Platelets: 242 10*3/uL (ref 150–400)
RBC: 3.84 MIL/uL — ABNORMAL LOW (ref 3.87–5.11)
RDW: 12.9 % (ref 11.5–15.5)
WBC: 5.8 10*3/uL (ref 4.0–10.5)

## 2013-05-08 LAB — URINE MICROSCOPIC-ADD ON

## 2013-05-08 LAB — TROPONIN I: Troponin I: <0.3 ng/mL (ref <0.30)

## 2013-05-08 MED ORDER — SODIUM CHLORIDE 0.9 % IV SOLN
INTRAVENOUS | Status: DC
Start: 2013-05-08 — End: 2013-05-08
  Administered 2013-05-08: 19:00:00 via INTRAVENOUS

## 2013-05-08 NOTE — ED Notes (Addendum)
[facility] staff, Susan Leon pt "not eating, not sitting, not following commands; her behavior has changed."

## 2013-05-08 NOTE — Discharge Instructions (Signed)
Dementia Dementia is a general term for problems with brain function. A person with dementia has memory loss and a hard time with at least one other brain function such as thinking, speaking, or problem solving. Dementia can affect social functioning, how you do your job, your mood, or your personality. The changes may be hidden for a long time. The earliest forms of this disease are usually not detected by family or friends. Dementia can be:  Irreversible.  Potentially reversible.  Partially reversible.  Progressive. This means it can get worse over time. CAUSES  Irreversible dementia causes may include:  Degeneration of brain cells (Alzheimer's disease or lewy body dementia).  Multiple small strokes (vascular dementia).  Infection (chronic meningitis or Creutzfelt-Jakob disease).  Frontotemporal dementia. This affects younger people, age 40 to 70, compared to those who have Alzheimer's disease.  Dementia associated with other disorders like Parkinson's disease, Huntington's disease, or HIV-associated dementia. Potentially or partially reversible dementia causes may include:  Medicines.  Metabolic causes such as excessive alcohol intake, vitamin B12 deficiency, or thyroid disease.  Masses or pressure in the brain such as a tumor, blood clot, or hydrocephalus. SYMPTOMS  Symptoms are often hard to detect. Family members or coworkers may not notice them early in the disease process. Different people with dementia may have different symptoms. Symptoms can include:  A hard time with memory, especially recent memory. Long-term memory may not be impaired.  Asking the same question multiple times or forgetting something someone just said.  A hard time speaking your thoughts or finding certain words.  A hard time solving problems or performing familiar tasks (such as how to use a telephone).  Sudden changes in mood.  Changes in personality, especially increasing moodiness or  mistrust.  Depression.  A hard time understanding complex ideas that were never a problem in the past. DIAGNOSIS  There are no specific tests for dementia.   Your caregiver may recommend a thorough evaluation. This is because some forms of dementia can be reversible. The evaluation will likely include a physical exam and getting a detailed history from you and a family member. The history often gives the best clues and suggestions for a diagnosis.  Memory testing may be done. A detailed brain function evaluation called neuropsychologic testing may be helpful.  Lab tests and brain imaging (such as a CT scan or MRI scan) are sometimes important.  Sometimes observation and re-evaluation over time is very helpful. TREATMENT  Treatment depends on the cause.   If the problem is a vitamin deficiency, it may be helped or cured with supplements.  For dementias such as Alzheimer's disease, medicines are available to stabilize or slow the course of the disease. There are no cures for this type of dementia.  Your caregiver can help direct you to groups, organizations, and other caregivers to help with decisions in the care of you or your loved one. HOME CARE INSTRUCTIONS The care of individuals with dementia is varied and dependent upon the progression of the dementia. The following suggestions are intended for the person living with, or caring for, the person with dementia.  Create a safe environment.  Remove the locks on bathroom doors to prevent the person from accidentally locking himself or herself in.  Use childproof latches on kitchen cabinets and any place where cleaning supplies, chemicals, or alcohol are kept.  Use childproof covers in unused electrical outlets.  Install childproof devices to keep doors and windows secured.  Remove stove knobs or install safety   knobs and an automatic shut-off on the stove.  Lower the temperature on water heaters.  Label medicines and keep them  locked up.  Secure knives, lighters, matches, power tools, and guns, and keep these items out of reach.  Keep the house free from clutter. Remove rugs or anything that might contribute to a fall.  Remove objects that might break and hurt the person.  Make sure lighting is good, both inside and outside.  Install grab rails as needed.  Use a monitoring device to alert you to falls or other needs for help.  Reduce confusion.  Keep familiar objects and people around.  Use night lights or dim lights at night.  Label items or areas.  Use reminders, notes, or directions for daily activities or tasks.  Keep a simple, consistent routine for waking, meals, bathing, dressing, and bedtime.  Create a calm, quiet environment.  Place large clocks and calendars prominently.  Display emergency numbers and home address near all telephones.  Use cues to establish different times of the day. An example is to open curtains to let the natural light in during the day.   Use effective communication.  Choose simple words and short sentences.  Use a gentle, calm tone of voice.  Be careful not to interrupt.  If the person is struggling to find a word or communicate a thought, try to provide the word or thought.  Ask one question at a time. Allow the person ample time to answer questions. Repeat the question again if the person does not respond.  Reduce nighttime restlessness.  Provide a comfortable bed.  Have a consistent nighttime routine.  Ensure a regular walking or physical activity schedule. Involve the person in daily activities as much as possible.  Limit napping during the day.  Limit caffeine.  Attend social events that stimulate rather than overwhelm the senses.  Encourage good nutrition and hydration.  Reduce distractions during meal times and snacks.  Avoid foods that are too hot or too cold.  Monitor chewing and swallowing ability.  Continue with routine vision,  hearing, dental, and medical screenings.  Only give over-the-counter or prescription medicines as directed by the caregiver.  Monitor driving abilities. Do not allow the person to drive when safe driving is no longer possible.  Register with an identification program which could provide location assistance in the event of a missing person situation. SEEK MEDICAL CARE IF:   New behavioral problems start such as moodiness, aggressiveness, or seeing things that are not there (hallucinations).  Any new problem with brain function happens. This includes problems with balance, speech, or falling a lot.  Problems with swallowing develop.  Any symptoms of other illness happen. Small changes or worsening in any aspect of brain function can be a sign that the illness is getting worse. It can also be a sign of another medical illness such as infection. Seeing a caregiver right away is important. SEEK IMMEDIATE MEDICAL CARE IF:   A fever develops.  New or worsened confusion develops.  New or worsened sleepiness develops.  Staying awake becomes hard to do. Document Released: 09/12/2000 Document Revised: 06/11/2011 Document Reviewed: 08/14/2010 Exit[employer] Patient Information 2014 [employer].  

## 2013-05-08 NOTE — ED Provider Notes (Signed)
CSN: [id number]     Arrival date & time 05/08/13  1640 History   First MD Initiated Contact with Patient 05/08/13 1644     Chief Complaint  Patient presents with  . Altered Mental Status   (Consider location/radiation/quality/duration/timing/severity/associated sxs/prior Treatment) Patient is a 80 y.o. female presenting with altered mental status. The history is provided by the patient, the EMS personnel and medical records. The history is limited by the condition of the patient.  Altered Mental Status  patient here with altered mental status from nursing home. She has a history of dementia and recently diagnosed with a UTI and was noted to be less responsive today. No reported history of trauma. No reported history of fever, vomiting, diarrhea. EMS was called and patient transported here. No treatment used prior to arrival other than her being on antibiotics. Nothing makes her symptoms better or worse.  Past Medical History  Diagnosis Date  . Dementia    Past Surgical History  Procedure Laterality Date  . Abdominal hysterectomy     Family History  Problem Relation Age of Onset  . Breast cancer Sister   . Breast cancer Mother    History  Substance Use Topics  . Smoking status: Never Smoker   . Smokeless tobacco: Never Used  . Alcohol Use: No   OB History   Grav Para Term Preterm Abortions TAB SAB Ect Mult Living                 Review of Systems  Unable to perform ROS   Allergies  Ivp dye  Home Medications   Current Outpatient Rx  Name  Route  Sig  Dispense  Refill  . Cholecalciferol 2000 UNITS CAPS   Oral   Take 1 capsule by mouth daily.         Marland Kitchen ibuprofen (ADVIL,MOTRIN) 200 MG tablet   Oral   Take 200 mg by mouth every 6 (six) hours as needed for pain.         Marland Kitchen LORazepam (ATIVAN) 0.5 MG tablet   Oral   Take 1 tablet (0.5 mg total) by mouth every 6 (six) hours as needed.   21 tablet   5   . EXPIRED: PARoxetine (PAXIL) 20 MG tablet   Oral   Take 1  tablet (20 mg total) by mouth every morning.   30 tablet   5   . polyethylene glycol powder (MIRALAX) powder      Mix one capful in 8 ounces of water and drink every night   255 g   0   . tiZANidine (ZANAFLEX) 4 MG tablet   Oral   Take 4 mg by mouth every 6 (six) hours as needed.         . topiramate (TOPAMAX) 100 MG tablet   Oral   Take 100 mg by mouth 2 (two) times daily.          SpO2 97% Physical Exam  Nursing note and vitals reviewed. Constitutional: She appears well-developed and well-nourished.  Non-toxic appearance. No distress.  HENT:  Head: Normocephalic and atraumatic.  Eyes: Conjunctivae, EOM and lids are normal. Pupils are equal, round, and reactive to light.  Neck: Normal range of motion. Neck supple. No tracheal deviation present. No mass present.  Cardiovascular: Normal rate, regular rhythm and normal heart sounds.  Exam reveals no gallop.   No murmur heard. Pulmonary/Chest: Effort normal and breath sounds normal. No stridor. No respiratory distress. She has no decreased breath sounds. She  has no wheezes. She has no rhonchi. She has no rales.  Abdominal: Soft. Normal appearance and bowel sounds are normal. She exhibits no distension. There is no tenderness. There is no rebound and no CVA tenderness.  Musculoskeletal: Normal range of motion. She exhibits no edema and no tenderness.  Neurological: She is alert. She has normal strength. No cranial nerve deficit or sensory deficit. GCS eye subscore is 4. GCS verbal subscore is 4. GCS motor subscore is 6.  Skin: Skin is warm and dry. No abrasion and no rash noted.  Psychiatric: Her affect is blunt. Her speech is tangential. She is slowed.    ED Course  Procedures (including critical care time) Labs Review Labs Reviewed  URINE CULTURE  CBC WITH DIFFERENTIAL  COMPREHENSIVE METABOLIC PANEL  URINALYSIS, ROUTINE W REFLEX MICROSCOPIC  TROPONIN I   Imaging Review No results found.  EKG Interpretation     Date/Time:  Friday May 08 2013 17:05:43 EST Ventricular Rate:  85 PR Interval:  182 QRS Duration: 103 QT Interval:  397 QTC Calculation: 472 R Axis:   73 Text Interpretation:  Normal sinus rhythm First degree A-V block No significant change since last tracing Confirmed by Madalynn Pickelsimer  MD, Jarrod Bodkins ([id number]) on 05/08/2013 6:51:07 PM            MDM  No diagnosis found. Evaluation here negative, pt rechecked and alert and active, [facility]    Toy BakerAnthony T Kennidee Heyne, MD 05/08/13 [phone]

## 2013-05-08 NOTE — ED Notes (Addendum)
Per EMS pt hx of dementia "semi alert and talking" [facility]. Pt recently diagnosed with UTI last week and pt more confused than normal. NSR for EMS en route.

## 2013-05-08 NOTE — ED Notes (Signed)
Bed: [id number] Expected date:  Expected time:  Means of arrival:  Comments: Ems/altered status

## 2013-05-09 LAB — URINE CULTURE
COLONY COUNT: NO GROWTH
CULTURE: NO GROWTH

## 2014-11-08 ENCOUNTER — Encounter (HOSPITAL_COMMUNITY): Payer: Self-pay | Admitting: Emergency Medicine

## 2014-11-08 ENCOUNTER — Emergency Department (HOSPITAL_COMMUNITY)
Admission: EM | Admit: 2014-11-08 | Discharge: 2014-11-08 | Disposition: A | Discharge status: Home / Self Care | Payer: Medicare Other | Attending: Emergency Medicine | Admitting: Emergency Medicine

## 2014-11-08 ENCOUNTER — Emergency Department (HOSPITAL_COMMUNITY): Payer: Medicare Other

## 2014-11-08 DIAGNOSIS — F0390 Unspecified dementia without behavioral disturbance: Secondary | ICD-10-CM | POA: Insufficient documentation

## 2014-11-08 DIAGNOSIS — Z043 Encounter for examination and observation following other accident: Secondary | ICD-10-CM | POA: Diagnosis not present

## 2014-11-08 DIAGNOSIS — Z79899 Other long term (current) drug therapy: Secondary | ICD-10-CM | POA: Diagnosis not present

## 2014-11-08 DIAGNOSIS — W19XXXA Unspecified fall, initial encounter: Secondary | ICD-10-CM

## 2014-11-08 LAB — URINALYSIS, ROUTINE W REFLEX MICROSCOPIC
Bilirubin Urine: NEGATIVE
Glucose, UA: NEGATIVE mg/dL
KETONES UR: 15 mg/dL — AB
LEUKOCYTES UA: NEGATIVE
Nitrite: NEGATIVE
Protein, ur: NEGATIVE mg/dL
Specific Gravity, Urine: 1.021 (ref 1.005–1.030)
UROBILINOGEN UA: 1 mg/dL (ref 0.0–1.0)
pH: 7 (ref 5.0–8.0)

## 2014-11-08 LAB — URINE MICROSCOPIC-ADD ON

## 2014-11-08 NOTE — Progress Notes (Signed)
CSW met with patient at bedside. However, the patient is not verbally communicative. Daughter was present.   Daughter confirms that the patient is from [place]. Also, she confirms that the patient presents to [facility], but states she is not aware of what caused the patient to fall. Daughter states that the patient does not fall often and says that the patient receives assistance with completing her ADL's.   Daughter states that the patient is currently in the memory unit at the facility, and feels that she is at the appropriate level of care.   Daughter states that the patient walks with no equipment.   Daughter states that she and the patient's husband are primary support. She states that she and patient's husband both live in [place].   Daughter states that she does not have any questions at this time.  Daughter/ Valora Piccolo [phone], [place] [facility] CSW 11/08/2014 5:07 PM

## 2014-11-08 NOTE — ED Notes (Signed)
Bed: ZO10 Expected date:  Expected time:  Means of arrival:  Comments: EMS - Nursing home Fall

## 2014-11-08 NOTE — ED Notes (Signed)
[facility] called  

## 2014-11-08 NOTE — ED Provider Notes (Signed)
CSN: [id number]     Arrival date & time 11/08/14  1426 History   First MD Initiated Contact with Patient 11/08/14 1441     Chief Complaint  Patient presents with  . Fall     (Consider location/radiation/quality/duration/timing/severity/associated sxs/prior Treatment) HPI Comments: 81 year old female with past medical history including dementia who presents with fall. Unable to obtain complete history due to patient's underlying dementia. EMS brought patient in from her nursing facility with a complaint of unwitnessed fall. Staff denied any loss of consciousness at the scene. Patient without complaints during transport. No obvious evidence of injury per EMS.  Patient is a 81 y.o. female presenting with fall. The history is provided by the EMS personnel.  Fall    Past Medical History  Diagnosis Date  . Dementia    Past Surgical History  Procedure Laterality Date  . Abdominal hysterectomy     Family History  Problem Relation Age of Onset  . Breast cancer Sister   . Breast cancer Mother    History  Substance Use Topics  . Smoking status: Never Smoker   . Smokeless tobacco: Never Used  . Alcohol Use: No   OB History    No data available     Review of Systems  Unable to perform ROS: Dementia      Allergies  Ivp dye  Home Medications   Prior to Admission medications   Medication Sig Start Date End Date Taking? Authorizing Provider  acetaminophen (TYLENOL) 325 MG tablet Take 650 mg by mouth 2 (two) times daily.   Yes Historical Provider, MD  alendronate (FOSAMAX) 35 MG tablet Take 35 mg by mouth every Saturday. Take with a full glass of water on an empty stomach.   Yes Historical Provider, MD  divalproex (DEPAKOTE SPRINKLE) 125 MG capsule Take 125 mg by mouth 2 (two) times daily.   Yes Historical Provider, MD  furosemide (LASIX) 20 MG tablet Take 10 mg by mouth every Monday, Wednesday, and Friday.    Yes Historical Provider, MD  loperamide (IMODIUM) 2 MG capsule Take 2  mg by mouth as needed for diarrhea or loose stools.   Yes Historical Provider, MD  ondansetron (ZOFRAN) 4 MG tablet Take 4 mg by mouth every 6 (six) hours as needed for nausea or vomiting.   Yes Historical Provider, MD  PARoxetine (PAXIL) 10 MG tablet Take 10 mg by mouth daily with breakfast.   Yes Historical Provider, MD  polyethylene glycol (MIRALAX / GLYCOLAX) packet Take 17 g by mouth daily with breakfast.    Yes Historical Provider, MD  risperiDONE (RISPERDAL) 0.25 MG tablet Take 0.25 mg by mouth See admin instructions. Takes on Tuesday's and Friday's   Yes Historical Provider, MD  simvastatin (ZOCOR) 10 MG tablet Take 10 mg by mouth daily with breakfast.    Yes Historical Provider, MD   BP 132/50 mmHg  Pulse 79  Temp(Src) 98.2 F (36.8 C) (Oral)  Resp 14  SpO2 96% Physical Exam  Constitutional: No distress.  Thin, frail-appearing woman in no acute distress  HENT:  Head: Normocephalic and atraumatic.  Mouth/Throat: Oropharynx is clear and moist.  Moist mucous membranes  Eyes: Conjunctivae are normal. Pupils are equal, round, and reactive to light.  Neck: No tracheal deviation present.  Cardiovascular: Normal rate, regular rhythm and normal heart sounds.   No murmur heard. Pulmonary/Chest: Effort normal and breath sounds normal. No stridor. She exhibits no tenderness.  Abdominal: Soft. Bowel sounds are normal. She exhibits no distension. There is  no tenderness.  Musculoskeletal: She exhibits no edema or tenderness.  Neurological:  Awake, smiling, unable to follow commands, nonverbal  Skin: Skin is warm and dry.  No ecchymoses  Nursing note and vitals reviewed.   ED Course  Procedures (including critical care time) Labs Review Labs Reviewed  URINALYSIS, ROUTINE W REFLEX MICROSCOPIC (NOT AT [facility]) - Abnormal; Notable for the following:    Hgb urine dipstick TRACE (*)    Ketones, ur 15 (*)    All other components within normal limits  URINE CULTURE  URINE MICROSCOPIC-ADD ON     Imaging Review Dg Chest 1 View  11/08/2014   CLINICAL DATA:  82 year old female with history of unwitnessed fall earlier today.  EXAM: CHEST  1 VIEW  COMPARISON:  Chest x-ray 11/25/2013.  FINDINGS: Lung volumes are low. No consolidative airspace disease. No pleural effusions. No pneumothorax. No pulmonary nodule or mass noted. Pulmonary vasculature and the cardiomediastinal silhouette are within normal limits. Atherosclerosis in the thoracic aorta. Surgical clips project over the right upper quadrant of the abdomen, likely from prior cholecystectomy. Soft tissue anchor in the right humeral head, likely from prior rotator cuff surgery.  IMPRESSION: 1. Low lung volumes without radiographic evidence of acute cardiopulmonary disease.   Electronically Signed   By: Trudie Reed M.D.   On: 11/08/2014 15:07   Ct Head Wo Contrast  11/08/2014   CLINICAL DATA:  Unwitnessed fall.  Nonverbal.  Dementia.  EXAM: CT HEAD WITHOUT CONTRAST  CT CERVICAL SPINE WITHOUT CONTRAST  TECHNIQUE: Multidetector CT imaging of the head and cervical spine was performed following the standard protocol without intravenous contrast. Multiplanar CT image reconstructions of the cervical spine were also generated.  COMPARISON:  05/08/2013, 06/01/2012  FINDINGS: CT HEAD FINDINGS  There is no evidence of mass effect, midline shift, or extra-axial fluid collections. There is no evidence of a space-occupying lesion or intracranial hemorrhage. There is no evidence of a cortical-based area of acute infarction. There is generalized cerebral atrophy. There is periventricular white matter low attenuation likely secondary to microangiopathy.  The ventricles and sulci are appropriate for the patient's age. The basal cisterns are patent.  Visualized portions of the orbits are unremarkable. The visualized portions of the paranasal sinuses and mastoid air cells are unremarkable.  The osseous structures are unremarkable.  CT CERVICAL SPINE FINDINGS   The alignment is anatomic. The vertebral body heights are maintained. There is no acute fracture. There is 2 mm of anterolisthesis of C3 on C4 secondary to facet disease. There is 4 mm of anterolisthesis of C7 on T1 secondary to facet arthropathy. The prevertebral soft tissues are normal. The intraspinal soft tissues are not fully imaged on this examination due to poor soft tissue contrast, but there is no gross soft tissue abnormality.  There is degenerative disc disease at C3-4, C4-5, C5-6 and C6-7. There is moderate right facet arthropathy at C2-3. There is moderate right facet arthropathy and right uncovertebral degenerative change at C3-4 resulting in right foraminal stenosis. There is bilateral uncovertebral degenerative change at C4-5 resulting in bilateral foraminal narrowing. Bilateral uncovertebral degenerative changes at C5-6 resulting in bilateral foraminal narrowing. Bilateral uncovertebral degenerative changes at C6-7. Severe bilateral facet arthropathy at C7-T1.  The visualized portions of the lung apices demonstrate no focal abnormality.  IMPRESSION: 1. No acute intracranial pathology. 2. No acute osseous injury of the cervical spine.   Electronically Signed   By: Elige Ko   On: 11/08/2014 15:35   Ct Cervical Spine  Wo Contrast  11/08/2014   CLINICAL DATA:  Unwitnessed fall.  Nonverbal.  Dementia.  EXAM: CT HEAD WITHOUT CONTRAST  CT CERVICAL SPINE WITHOUT CONTRAST  TECHNIQUE: Multidetector CT imaging of the head and cervical spine was performed following the standard protocol without intravenous contrast. Multiplanar CT image reconstructions of the cervical spine were also generated.  COMPARISON:  05/08/2013, 06/01/2012  FINDINGS: CT HEAD FINDINGS  There is no evidence of mass effect, midline shift, or extra-axial fluid collections. There is no evidence of a space-occupying lesion or intracranial hemorrhage. There is no evidence of a cortical-based area of acute infarction. There is generalized  cerebral atrophy. There is periventricular white matter low attenuation likely secondary to microangiopathy.  The ventricles and sulci are appropriate for the patient's age. The basal cisterns are patent.  Visualized portions of the orbits are unremarkable. The visualized portions of the paranasal sinuses and mastoid air cells are unremarkable.  The osseous structures are unremarkable.  CT CERVICAL SPINE FINDINGS  The alignment is anatomic. The vertebral body heights are maintained. There is no acute fracture. There is 2 mm of anterolisthesis of C3 on C4 secondary to facet disease. There is 4 mm of anterolisthesis of C7 on T1 secondary to facet arthropathy. The prevertebral soft tissues are normal. The intraspinal soft tissues are not fully imaged on this examination due to poor soft tissue contrast, but there is no gross soft tissue abnormality.  There is degenerative disc disease at C3-4, C4-5, C5-6 and C6-7. There is moderate right facet arthropathy at C2-3. There is moderate right facet arthropathy and right uncovertebral degenerative change at C3-4 resulting in right foraminal stenosis. There is bilateral uncovertebral degenerative change at C4-5 resulting in bilateral foraminal narrowing. Bilateral uncovertebral degenerative changes at C5-6 resulting in bilateral foraminal narrowing. Bilateral uncovertebral degenerative changes at C6-7. Severe bilateral facet arthropathy at C7-T1.  The visualized portions of the lung apices demonstrate no focal abnormality.  IMPRESSION: 1. No acute intracranial pathology. 2. No acute osseous injury of the cervical spine.   Electronically Signed   By: Elige Ko   On: 11/08/2014 15:35     EKG Interpretation None      MDM   Final diagnoses:  Fall, initial encounter   81 year old female with dementia who presents from nursing facility after unwitnessed fall. Patient awake, nonverbal but in no acute distress at presentation. Vital signs stable. No evidence of injury  on physical exam. Patient demonstrated normal range of motion of all joints and no ecchymoses or signs of trauma on exam. Obtained CT of head and C-spine which were unremarkable. UA without evidence of infection. Given patient's well appearance, normal vital signs, and no chest or abdominal tenderness on exam, I feel that the patient is stable for discharge home. Provided facility w/ return precautions.  Laurence Spates, MD 11/08/14 [phone]

## 2014-11-08 NOTE — ED Notes (Signed)
Patient here from [name] with c/o of unwitnessed fall. Nonverbal. Staff denied no LOC. DNR.

## 2014-11-10 LAB — URINE CULTURE
CULTURE: NO GROWTH
Special Requests: NORMAL

## 2015-05-19 ENCOUNTER — Emergency Department (HOSPITAL_COMMUNITY)
Admission: EM | Admit: 2015-05-19 | Discharge: 2015-05-19 | Disposition: A | Discharge status: Home / Self Care | Payer: Medicare Other | Attending: Emergency Medicine | Admitting: Emergency Medicine

## 2015-05-19 ENCOUNTER — Emergency Department (HOSPITAL_COMMUNITY): Payer: Medicare Other

## 2015-05-19 ENCOUNTER — Encounter (HOSPITAL_COMMUNITY): Payer: Self-pay | Admitting: Emergency Medicine

## 2015-05-19 DIAGNOSIS — S0083XA Contusion of other part of head, initial encounter: Secondary | ICD-10-CM | POA: Diagnosis present

## 2015-05-19 DIAGNOSIS — Y92128 Other place in nursing home as the place of occurrence of the external cause: Secondary | ICD-10-CM | POA: Diagnosis not present

## 2015-05-19 DIAGNOSIS — Z79899 Other long term (current) drug therapy: Secondary | ICD-10-CM | POA: Diagnosis not present

## 2015-05-19 DIAGNOSIS — W19XXXA Unspecified fall, initial encounter: Secondary | ICD-10-CM

## 2015-05-19 DIAGNOSIS — Z87448 Personal history of other diseases of urinary system: Secondary | ICD-10-CM | POA: Diagnosis not present

## 2015-05-19 DIAGNOSIS — Y998 Other external cause status: Secondary | ICD-10-CM | POA: Insufficient documentation

## 2015-05-19 DIAGNOSIS — Y9389 Activity, other specified: Secondary | ICD-10-CM | POA: Diagnosis not present

## 2015-05-19 DIAGNOSIS — M1990 Unspecified osteoarthritis, unspecified site: Secondary | ICD-10-CM | POA: Diagnosis not present

## 2015-05-19 DIAGNOSIS — S0003XA Contusion of scalp, initial encounter: Secondary | ICD-10-CM | POA: Insufficient documentation

## 2015-05-19 DIAGNOSIS — W06XXXA Fall from bed, initial encounter: Secondary | ICD-10-CM | POA: Diagnosis not present

## 2015-05-19 DIAGNOSIS — F0390 Unspecified dementia without behavioral disturbance: Secondary | ICD-10-CM | POA: Insufficient documentation

## 2015-05-19 DIAGNOSIS — K5900 Constipation, unspecified: Secondary | ICD-10-CM | POA: Insufficient documentation

## 2015-05-19 HISTORY — DX: Headache, unspecified: R51.9

## 2015-05-19 HISTORY — DX: Unspecified osteoarthritis, unspecified site: M19.90

## 2015-05-19 HISTORY — DX: Disorder of kidney and ureter, unspecified: N28.9

## 2015-05-19 HISTORY — DX: Constipation, unspecified: K59.00

## 2015-05-19 HISTORY — DX: Headache: R51

## 2015-05-19 LAB — CBG MONITORING, ED: Glucose-Capillary: 86 mg/dL (ref 65–99)

## 2015-05-19 MED ORDER — ACETAMINOPHEN 325 MG PO TABS: 650.0000 mg | ORAL_TABLET | Freq: Once | ORAL | Status: DC

## 2015-05-19 NOTE — ED Notes (Signed)
[facility]AR at bedside 

## 2015-05-19 NOTE — ED Notes (Signed)
MD at bedside. 
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
## 2015-05-19 NOTE — ED Notes (Signed)
Called PTAR 

## 2015-05-19 NOTE — ED Notes (Signed)
Patient transported to CT 

## 2015-05-19 NOTE — ED Notes (Signed)
Pt placed in yellow socks with fall risk bracelet in place.  Head of bed lowered, back up, to discourage climbing out of bed.  Door left open.  Possible need for bed alarm discussed with charge nurse.
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
## 2015-07-07 ENCOUNTER — Emergency Department (HOSPITAL_COMMUNITY): Payer: Medicare Other

## 2015-07-07 ENCOUNTER — Emergency Department (HOSPITAL_COMMUNITY)
Admission: EM | Admit: 2015-07-07 | Discharge: 2015-07-07 | Disposition: A | Discharge status: Home / Self Care | Payer: Medicare Other | Attending: Emergency Medicine | Admitting: Emergency Medicine

## 2015-07-07 ENCOUNTER — Encounter (HOSPITAL_COMMUNITY): Payer: Self-pay

## 2015-07-07 DIAGNOSIS — M1990 Unspecified osteoarthritis, unspecified site: Secondary | ICD-10-CM | POA: Diagnosis not present

## 2015-07-07 DIAGNOSIS — Z79899 Other long term (current) drug therapy: Secondary | ICD-10-CM | POA: Insufficient documentation

## 2015-07-07 DIAGNOSIS — W1839XA Other fall on same level, initial encounter: Secondary | ICD-10-CM | POA: Diagnosis not present

## 2015-07-07 DIAGNOSIS — Z87448 Personal history of other diseases of urinary system: Secondary | ICD-10-CM | POA: Insufficient documentation

## 2015-07-07 DIAGNOSIS — K5900 Constipation, unspecified: Secondary | ICD-10-CM | POA: Insufficient documentation

## 2015-07-07 DIAGNOSIS — Y998 Other external cause status: Secondary | ICD-10-CM | POA: Diagnosis not present

## 2015-07-07 DIAGNOSIS — F0390 Unspecified dementia without behavioral disturbance: Secondary | ICD-10-CM | POA: Diagnosis not present

## 2015-07-07 DIAGNOSIS — Y92129 Unspecified place in nursing home as the place of occurrence of the external cause: Secondary | ICD-10-CM | POA: Insufficient documentation

## 2015-07-07 DIAGNOSIS — Y9389 Activity, other specified: Secondary | ICD-10-CM | POA: Insufficient documentation

## 2015-07-07 DIAGNOSIS — W19XXXA Unspecified fall, initial encounter: Secondary | ICD-10-CM

## 2015-07-07 DIAGNOSIS — Z043 Encounter for examination and observation following other accident: Secondary | ICD-10-CM | POA: Diagnosis present

## 2015-07-07 NOTE — Discharge Instructions (Signed)
You were seen and evaluated today for your fall. Follow-up with your primary care physician for reevaluation. Imaging of your head and neck were normal. There was no sign of other focal acute injury.  Fall Prevention in Hospitals, Adult As a hospital patient, your condition and the treatments you receive can increase your risk for falls. Some additional risk factors for falls in a hospital include:  Being in an unfamiliar environment.  Being on bed rest.  Your surgery.  Taking certain medicines.  Your tubing requirements, such as intravenous (IV) therapy or catheters. It is important that you learn how to decrease fall risks while at the hospital. Below are important tips that can help prevent falls. SAFETY TIPS FOR PREVENTING FALLS Talk about your risk of falling.  Ask your health care provider why you are at risk for falling. Is it your medicine, illness, tubing placement, or something else?  Make a plan with your health care provider to keep you safe from falls.  Ask your health care provider or pharmacist about side effects of your medicines. Some medicines can make you dizzy or affect your coordination. Ask for help.  Ask for help before getting out of bed. You may need to press your call button.  Ask for assistance in getting safely to the toilet.  Ask for a walker or cane to be put at your bedside. Ask that most of the side rails on your bed be placed up before your health care provider leaves the room.  Ask family or friends to sit with you.  Ask for things that are out of your reach, such as your glasses, hearing aids, telephone, bedside table, or call button. Follow these tips to avoid falling:  Stay lying or seated, rather than standing, while waiting for help.  Wear rubber-soled slippers or shoes whenever you walk in the hospital.  Avoid quick, sudden movements.  Change positions slowly.  Sit on the side of your bed before standing.  Stand up slowly and wait  before you start to walk.  Let your health care provider know if there is a spill on the floor.  Pay careful attention to the medical equipment, electrical cords, and tubes around you.  When you need help, use your call button by your bed or in the bathroom. Wait for one of your health care providers to help you.  If you feel dizzy or unsure of your footing, return to bed and wait for assistance.  Avoid being distracted by the TV, telephone, or another person in your room.  Do not lean or support yourself on rolling objects, such as IV poles or bedside tables.   This information is not intended to replace advice given to you by your health care provider. Make sure you discuss any questions you have with your health care provider.   Document Released: 03/16/2000 Document Revised: 04/09/2014 Document Reviewed: 11/25/2011 Elsevier Interactive Patient Education [employer]2016 [employer]

## 2015-07-07 NOTE — ED Provider Notes (Signed)
CSN: [id number]     Arrival date & time 07/07/15  1446 History   First MD Initiated Contact with Patient 07/07/15 1508     Chief Complaint  Patient presents with  . Fall     (Consider location/radiation/quality/duration/timing/severity/associated sxs/prior Treatment) HPI Comments: 82 -year-old female with history of dementia, arthritis, reported frequent falls presents for reported fall. The patient was found on the ground today. It was presumed that she had fallen again. Per report patient is at her baseline mentally. Patient is confused but pleasant during evaluation. She is able to answer simple questions. Denies any pain.  Patient is a 82 y.o. female presenting with fall.  Fall    Past Medical History  Diagnosis Date  . Dementia   . Renal disorder   . Arthritis   . Headache   . Constipation    Past Surgical History  Procedure Laterality Date  . Abdominal hysterectomy     Family History  Problem Relation Age of Onset  . Breast cancer Sister   . Breast cancer Mother    Social History  Substance Use Topics  . Smoking status: Never Smoker   . Smokeless tobacco: Never Used  . Alcohol Use: No   OB History    No data available     Review of Systems  Unable to perform ROS: Dementia      Allergies  Ivp dye  Home Medications   Prior to Admission medications   Medication Sig Start Date End Date Taking? Authorizing Provider  acetaminophen (TYLENOL) 325 MG tablet Take 650 mg by mouth 2 (two) times daily.   Yes Historical Provider, MD  divalproex (DEPAKOTE SPRINKLE) 125 MG capsule Take 250 mg by mouth at bedtime.    Yes Historical Provider, MD  loperamide (IMODIUM) 2 MG capsule Take 2 mg by mouth as needed for diarrhea or loose stools.   Yes Historical Provider, MD  ondansetron (ZOFRAN) 4 MG tablet Take 4 mg by mouth every 6 (six) hours as needed for nausea or vomiting.   Yes Historical Provider, MD  PARoxetine (PAXIL) 10 MG tablet Take 10 mg by mouth daily with  breakfast.   Yes Historical Provider, MD  polyethylene glycol (MIRALAX / GLYCOLAX) packet Take 17 g by mouth daily with breakfast.    Yes Historical Provider, MD  Vitamin D, Ergocalciferol, (DRISDOL) 50000 units CAPS capsule Take 50,000 Units by mouth every 30 (thirty) days.   Yes Historical Provider, MD   BP 128/66 mmHg  Pulse 99  Temp(Src) 98.1 F (36.7 C) (Oral)  Resp 16  Ht 5' (1.524 m)  Wt 110 lb (49.896 kg)  BMI 21.48 kg/m2  SpO2 95% Physical Exam  Constitutional: She appears well-developed and well-nourished. No distress.  HENT:  Head: Normocephalic and atraumatic. Head is without Battle's sign, without abrasion and without laceration.    Right Ear: External ear normal.  Left Ear: External ear normal.  Nose: Nose normal.  Mouth/Throat: Oropharynx is clear and moist. No oropharyngeal exudate.  Eyes: EOM are normal. Pupils are equal, round, and reactive to light.  Neck: Normal range of motion and full passive range of motion without pain. Neck supple. No spinous process tenderness and no muscular tenderness present. Normal range of motion present.  Cardiovascular: Normal rate, regular rhythm, normal heart sounds and intact distal pulses.   Pulmonary/Chest: Effort normal. No respiratory distress. She has no wheezes. She has no rales. She exhibits no tenderness.  Abdominal: Soft. She exhibits no distension. There is no tenderness.  Musculoskeletal: Normal range of motion. She exhibits no edema or tenderness.  Examination I'm able to range all limbs without any pain. All joints palpated without tenderness. Patient able to stand up out of bed with assistance without any pain or difficulty.  Neurological: She is alert.  Patient alert and pleasant. Answering simple questions but confused and not able to give history.  Skin: Skin is warm and dry. Bruising (at the site depicted and yellow on the diagram. Appears to be healing bruise) noted. No rash noted. She is not diaphoretic.       Vitals reviewed.   ED Course  Procedures (including critical care time) Labs Review Labs Reviewed - No data to display  Imaging Review Ct Head Wo Contrast  07/07/2015  CLINICAL DATA:  Trauma.  Frequent falls.  Hematoma to back of head. EXAM: CT HEAD WITHOUT CONTRAST CT CERVICAL SPINE WITHOUT CONTRAST TECHNIQUE: Multidetector CT imaging of the head and cervical spine was performed following the standard protocol without intravenous contrast. Multiplanar CT image reconstructions of the cervical spine were also generated. COMPARISON:  05/19/2015 FINDINGS: CT HEAD FINDINGS Prominence of the sulci and ventricles are again noted compatible with brain atrophy. There is diffuse low-attenuation within the subcortical and periventricular white matter compatible with chronic microvascular disease. The paranasal sinuses are clear. The mastoid air cells appear clear. The calvarium is intact. CT CERVICAL SPINE FINDINGS Normal alignment of the cervical spine. The vertebral body heights are well preserved. There is disc space narrowing and ventral endplate spurring identified at C3-4 through C6-7. A first degree anterolisthesis of C7 on T1 noted. There is a mild curvature of the cervical spine which appears convex towards the left. IMPRESSION: 1. No acute intracranial abnormality. 2. Chronic microvascular disease and brain atrophy. 3. No evidence for cervical spine fracture. 4. Multi level cervical spondylosis. Electronically Signed   By: Signa Kell M.D.   On: 07/07/2015 17:05   Ct Cervical Spine Wo Contrast  07/07/2015  CLINICAL DATA:  Trauma.  Frequent falls.  Hematoma to back of head. EXAM: CT HEAD WITHOUT CONTRAST CT CERVICAL SPINE WITHOUT CONTRAST TECHNIQUE: Multidetector CT imaging of the head and cervical spine was performed following the standard protocol without intravenous contrast. Multiplanar CT image reconstructions of the cervical spine were also generated. COMPARISON:  05/19/2015 FINDINGS: CT HEAD  FINDINGS Prominence of the sulci and ventricles are again noted compatible with brain atrophy. There is diffuse low-attenuation within the subcortical and periventricular white matter compatible with chronic microvascular disease. The paranasal sinuses are clear. The mastoid air cells appear clear. The calvarium is intact. CT CERVICAL SPINE FINDINGS Normal alignment of the cervical spine. The vertebral body heights are well preserved. There is disc space narrowing and ventral endplate spurring identified at C3-4 through C6-7. A first degree anterolisthesis of C7 on T1 noted. There is a mild curvature of the cervical spine which appears convex towards the left. IMPRESSION: 1. No acute intracranial abnormality. 2. Chronic microvascular disease and brain atrophy. 3. No evidence for cervical spine fracture. 4. Multi level cervical spondylosis. Electronically Signed   By: Signa Kell M.D.   On: 07/07/2015 17:05   I have personally reviewed and evaluated these images and lab results as part of my medical decision-making.   EKG Interpretation None      MDM  Patient was seen and evaluated in stable condition. Able to stand without difficulty and without pain. Able to range all joints and extremities without pain. Imaging negative for acute process. Patient reportedly  at her  Final diagnoses:  Fall, initial encounter    1. Fall, recurrent    Leta BaptistEmily Roe Keyshaun Exley, MD 07/07/15 1728

## 2015-07-07 NOTE — ED Notes (Signed)
[facility] Expected date:  Expected time:  Means of arrival:  Comments: EMS/36F/fall

## 2015-07-07 NOTE — ED Notes (Signed)
[facility].  Per report, pt was found sitting on the floor of the common area.  No LOC was reported but frequent falls is reported.  Possible hematoma to back of head but not on blood thinners.  Pt is at her baseline of confused.  VS by EMS: 100/60, 90, 14.

## 2015-07-07 NOTE — ED Notes (Signed)
Pt's family wishes to discharge and take pt home them self., due to waiting time via [facility].

## 2015-09-18 ENCOUNTER — Emergency Department (HOSPITAL_COMMUNITY): Payer: Medicare Other

## 2015-09-18 ENCOUNTER — Encounter (HOSPITAL_COMMUNITY): Payer: Self-pay | Admitting: *Deleted

## 2015-09-18 ENCOUNTER — Emergency Department (HOSPITAL_COMMUNITY)
Admission: EM | Admit: 2015-09-18 | Discharge: 2015-09-18 | Disposition: A | Discharge status: Home / Self Care | Payer: Medicare Other | Attending: Emergency Medicine | Admitting: Emergency Medicine

## 2015-09-18 DIAGNOSIS — Y929 Unspecified place or not applicable: Secondary | ICD-10-CM | POA: Insufficient documentation

## 2015-09-18 DIAGNOSIS — Y999 Unspecified external cause status: Secondary | ICD-10-CM | POA: Diagnosis not present

## 2015-09-18 DIAGNOSIS — Z79899 Other long term (current) drug therapy: Secondary | ICD-10-CM | POA: Insufficient documentation

## 2015-09-18 DIAGNOSIS — M1990 Unspecified osteoarthritis, unspecified site: Secondary | ICD-10-CM | POA: Insufficient documentation

## 2015-09-18 DIAGNOSIS — Y939 Activity, unspecified: Secondary | ICD-10-CM | POA: Diagnosis not present

## 2015-09-18 DIAGNOSIS — W06XXXA Fall from bed, initial encounter: Secondary | ICD-10-CM | POA: Insufficient documentation

## 2015-09-18 DIAGNOSIS — S0990XA Unspecified injury of head, initial encounter: Secondary | ICD-10-CM | POA: Diagnosis present

## 2015-09-18 DIAGNOSIS — W19XXXA Unspecified fall, initial encounter: Secondary | ICD-10-CM

## 2015-09-18 DIAGNOSIS — S0101XA Laceration without foreign body of scalp, initial encounter: Secondary | ICD-10-CM | POA: Insufficient documentation

## 2015-09-18 NOTE — Discharge Instructions (Signed)
Your exam was reassuring. Her CT scans of your head and Neck are also reassuring. You received 1 staple for a wound in the back of your head. You will need to have this removed in 3 or 4 days. Return to ED for new or worsening symptoms. Follow-up with your PCP in the next one or 2 days.

## 2015-09-18 NOTE — ED Notes (Signed)
Per EMS report: [facility] [phone] and presents to the ED after falling.  Pt was trying to crawl in the bed when she fell down. Staff witnessed the fall as pt was falling.  Pt did not have a LOC. Pt has small laceration on the back right side of her head. Pt at her baseline mentation but pt has a Hx of severe dementia. Pt nonverbal.

## 2015-09-18 NOTE — ED Provider Notes (Signed)
CSN: [id number]     Arrival date & time 09/18/15  1810 History   First MD Initiated Contact with Patient 09/18/15 1828     Chief Complaint  Patient presents with  . Fall   Level V caveat: Dementia  (Consider location/radiation/quality/duration/timing/severity/associated sxs/prior Treatment) HPI Susan Leon is a 82 y.o. female history of dementia, per EMS she is coming from [facility] facility after a fall. Per nursing staff, patient was trying to get into bed, stumbled and fell sustaining a small laceration on the back of her head. Staff witnessed patient fall And reports no LOC, vomiting. Nursing reports patient is at baseline mentation. They reported history of severe dementia and the patient is nonverbal. No anticoagulation. Patient also has a DO NOT RESUSCITATE in place.  Past Medical History  Diagnosis Date  . Dementia   . Renal disorder   . Arthritis   . Headache   . Constipation    Past Surgical History  Procedure Laterality Date  . Abdominal hysterectomy     Family History  Problem Relation Age of Onset  . Breast cancer Sister   . Breast cancer Mother    Social History  Substance Use Topics  . Smoking status: Never Smoker   . Smokeless tobacco: Never Used  . Alcohol Use: No   OB History    No data available     Review of Systems  Unable to perform ROS: Dementia   A 10 point review of systems was completed and was negative except for pertinent positives and negatives as mentioned in the history of present illness     Allergies  Ivp dye  Home Medications   Prior to Admission medications   Medication Sig Start Date End Date Taking? Authorizing Provider  acetaminophen (TYLENOL) 325 MG tablet Take 650 mg by mouth 2 (two) times daily.   Yes Historical Provider, MD  divalproex (DEPAKOTE SPRINKLE) 125 MG capsule Take 250 mg by mouth at bedtime.    Yes Historical Provider, MD  loperamide (IMODIUM) 2 MG capsule Take 2 mg by mouth every 6 (six) hours as  needed for diarrhea or loose stools.    Yes Historical Provider, MD  ondansetron (ZOFRAN) 4 MG tablet Take 4 mg by mouth every 6 (six) hours as needed for nausea or vomiting.   Yes Historical Provider, MD  PARoxetine (PAXIL) 10 MG tablet Take 10 mg by mouth daily with breakfast.   Yes Historical Provider, MD  polyethylene glycol (MIRALAX / GLYCOLAX) packet Take 17 g by mouth daily with breakfast.    Yes Historical Provider, MD  Vitamin D, Ergocalciferol, (DRISDOL) 50000 units CAPS capsule Take 50,000 Units by mouth every 30 (thirty) days.   Yes Historical Provider, MD   BP 113/50 mmHg  Pulse 86  Temp(Src) 97.8 F (36.6 C) (Oral)  Resp 18  SpO2 100% Physical Exam  Constitutional: No distress.  HENT:  Small hematoma on right posterior occiput with small linear laceration, roughly 1 cm.  Eyes: Conjunctivae and EOM are normal. Pupils are equal, round, and reactive to light. Right eye exhibits no discharge. Left eye exhibits no discharge.  Neck:  No obvious signs of neck tenderness, however patient is nonverbal.  Cardiovascular: Normal rate and normal heart sounds.   Pulmonary/Chest: Effort normal and breath sounds normal.  Abdominal: Soft. There is no tenderness.  Musculoskeletal: Normal range of motion. She exhibits no tenderness.  Neurological:  She is alert, severe dementia.  Skin: She is not diaphoretic.    ED Course  Procedures (including critical care time) Labs Review Labs Reviewed - No data to display  Imaging Review Ct Head Wo Contrast  09/18/2015  CLINICAL DATA:  Status post fall today. Laceration on the posterior aspect of the head. Initial encounter. EXAM: CT HEAD WITHOUT CONTRAST CT CERVICAL SPINE WITHOUT CONTRAST TECHNIQUE: Multidetector CT imaging of the head and cervical spine was performed following the standard protocol without intravenous contrast. Multiplanar CT image reconstructions of the cervical spine were also generated. COMPARISON:  Head and cervical spine CT  scans 07/07/2015. FINDINGS: CT HEAD FINDINGS Extensive atrophy and chronic microvascular ischemic change are again seen. No evidence of acute intracranial abnormality including hemorrhage, infarct, mass lesion, mass effect, midline shift or abnormal extra-axial fluid collection. No hydrocephalus or pneumocephalus. The calvarium is intact. Imaged paranasal sinuses and mastoid air cells are clear. CT CERVICAL SPINE FINDINGS No cervical spine fracture is identified. Marked multilevel dog loss of disc space height is seen. 0.3 cm anterolisthesis C7 on T1 is unchanged. Prevertebral soft tissues appear normal. Lung apices are clear. IMPRESSION: No acute abnormality head or cervical spine. Extensive atrophy and chronic microvascular ischemic change. Multilevel cervical spondylosis. Electronically Signed   By: Drusilla Kanner M.D.   On: 09/18/2015 19:54   Ct Cervical Spine Wo Contrast  09/18/2015  CLINICAL DATA:  Status post fall today. Laceration on the posterior aspect of the head. Initial encounter. EXAM: CT HEAD WITHOUT CONTRAST CT CERVICAL SPINE WITHOUT CONTRAST TECHNIQUE: Multidetector CT imaging of the head and cervical spine was performed following the standard protocol without intravenous contrast. Multiplanar CT image reconstructions of the cervical spine were also generated. COMPARISON:  Head and cervical spine CT scans 07/07/2015. FINDINGS: CT HEAD FINDINGS Extensive atrophy and chronic microvascular ischemic change are again seen. No evidence of acute intracranial abnormality including hemorrhage, infarct, mass lesion, mass effect, midline shift or abnormal extra-axial fluid collection. No hydrocephalus or pneumocephalus. The calvarium is intact. Imaged paranasal sinuses and mastoid air cells are clear. CT CERVICAL SPINE FINDINGS No cervical spine fracture is identified. Marked multilevel dog loss of disc space height is seen. 0.3 cm anterolisthesis C7 on T1 is unchanged. Prevertebral soft tissues appear  normal. Lung apices are clear. IMPRESSION: No acute abnormality head or cervical spine. Extensive atrophy and chronic microvascular ischemic change. Multilevel cervical spondylosis. Electronically Signed   By: Drusilla Kanner M.D.   On: 09/18/2015 19:54   I have personally reviewed and evaluated these images and lab results as part of my medical decision-making.   EKG Interpretation None     LACERATION REPAIR Performed by: Sharlene Motts Authorized by: Sharlene Motts Consent: Verbal consent obtained. Risks and benefits: risks, benefits and alternatives were discussed Consent given by: patient Patient identity confirmed: provided demographic data Prepped and Draped in normal sterile fashion Wound explored  Laceration Location: Right occiput.  Laceration Length: 1 cm  No Foreign Bodies seen or palpated  Irrigation method: syringe Amount of cleaning: standard  Skin closure: Staples   Number of sutures: One staple   Patient tolerance: Patient tolerated the procedure well with no immediate complications.  MDM  Patient with dementia from [facility] nursing facility here for evaluation after witnessed fall. This is a recurrent problem for patient. We will obtain CT head and neck. One staple apply to head wound.Physical exam is reassuring. CT scans of head and neck are negative for any acute pathology. Patient at baseline, discussed ED course, discharge with daughter at bedside. She verbalizes understanding and agrees with this plan. Patient  is appropriate for discharge. Discussed with Dr. Rubin Payor. Final diagnoses:  Fall, initial encounter      Joycie Peek, PA-C 09/18/15 2042  Benjiman Core, MD 09/18/15 [phone]

## 2015-09-18 NOTE — ED Notes (Signed)
[facility] notified for patient transport back to [facility].

## 2015-09-18 NOTE — ED Notes (Signed)
Patient transported to CT 

## 2015-09-18 NOTE — ED Notes (Signed)
Report called to [facility], given to [place].

## 2015-10-20 ENCOUNTER — Encounter (HOSPITAL_COMMUNITY): Payer: Self-pay

## 2015-10-20 ENCOUNTER — Emergency Department (HOSPITAL_COMMUNITY): Payer: Medicare Other

## 2015-10-20 ENCOUNTER — Emergency Department (HOSPITAL_COMMUNITY)
Admission: EM | Admit: 2015-10-20 | Discharge: 2015-10-20 | Disposition: A | Discharge status: Home / Self Care | Payer: Medicare Other | Attending: Emergency Medicine | Admitting: Emergency Medicine

## 2015-10-20 DIAGNOSIS — S0121XA Laceration without foreign body of nose, initial encounter: Secondary | ICD-10-CM

## 2015-10-20 DIAGNOSIS — S0181XA Laceration without foreign body of other part of head, initial encounter: Secondary | ICD-10-CM | POA: Diagnosis not present

## 2015-10-20 DIAGNOSIS — Y999 Unspecified external cause status: Secondary | ICD-10-CM | POA: Diagnosis not present

## 2015-10-20 DIAGNOSIS — M1990 Unspecified osteoarthritis, unspecified site: Secondary | ICD-10-CM | POA: Insufficient documentation

## 2015-10-20 DIAGNOSIS — W1800XA Striking against unspecified object with subsequent fall, initial encounter: Secondary | ICD-10-CM | POA: Diagnosis not present

## 2015-10-20 DIAGNOSIS — Z79899 Other long term (current) drug therapy: Secondary | ICD-10-CM | POA: Insufficient documentation

## 2015-10-20 DIAGNOSIS — Y939 Activity, unspecified: Secondary | ICD-10-CM | POA: Insufficient documentation

## 2015-10-20 DIAGNOSIS — W19XXXA Unspecified fall, initial encounter: Secondary | ICD-10-CM

## 2015-10-20 DIAGNOSIS — Y92129 Unspecified place in nursing home as the place of occurrence of the external cause: Secondary | ICD-10-CM | POA: Diagnosis not present

## 2015-10-20 DIAGNOSIS — S0990XA Unspecified injury of head, initial encounter: Secondary | ICD-10-CM | POA: Diagnosis present

## 2015-10-20 MED ORDER — TETANUS-DIPHTH-ACELL PERTUSSIS 5-2.5-18.5 LF-MCG/0.5 IM SUSP
0.5000 mL | Freq: Once | INTRAMUSCULAR | Status: AC
  Administered 2015-10-20: 0.5 mL via INTRAMUSCULAR
  Filled 2015-10-20: qty 0.5

## 2015-10-20 NOTE — ED Notes (Signed)
Per EMS, Pt, from [facility], c/o facial lacerations r/t fall and hitting a nightstand this morning.  Hx of dementia and non-verbal.  Facility reports Pt is at neuro baseline.  Denies blood thinners.  Facility denies LOC.  EMS reports Pt was sitting on the edge of her bed and putting on pants when she fell.

## 2015-10-20 NOTE — ED Notes (Signed)
Dermabond at bedside.  

## 2015-10-20 NOTE — ED Notes (Signed)
Bed: [id number] Expected date:  Expected time:  Means of arrival:  Comments: EMS- 82yo M, low back pain/no injury

## 2015-10-20 NOTE — ED Notes (Signed)
Ptar called 

## 2015-10-20 NOTE — ED Notes (Addendum)
Call to [facility].  Spoke with nursing staff and notified them that pt is ready for discharge and I am notified that pt will need to return by PTAR.  Report given.  Will call for [facility] transport.

## 2015-10-20 NOTE — ED Notes (Addendum)
Gave pt sandwich, apple sauce and cheese

## 2015-10-20 NOTE — ED Notes (Signed)
Patient transported to CT 

## 2015-10-20 NOTE — ED Provider Notes (Signed)
CSN: [id number]     Arrival date & time 10/20/15  1324 History   First MD Initiated Contact with Patient 10/20/15 [phone]     Chief Complaint  Patient presents with  . Fall  . Head Laceration   LEVEL 5 CAVEAT - DEMENTIA  (Consider location/radiation/quality/duration/timing/severity/associated sxs/prior Treatment) HPI  82 year old female with a history of dementia presents after a fall in her nursing facility. Report is from EMS as the patient is unable to provide any history. Apparently she was getting on her pants while on her bed and then fell forward and hit the nightstand. She did not lose consciousness. Staff was in her room and caught her before she could fall down to the ground. Patient's winces in pain when EMS palpates her nose and face but otherwise does not talk, which is baseline. Patient has not been different from her baseline recently. Unknown tetanus status.  Past Medical History  Diagnosis Date  . Dementia   . Renal disorder   . Arthritis   . Headache   . Constipation    Past Surgical History  Procedure Laterality Date  . Abdominal hysterectomy     Family History  Problem Relation Age of Onset  . Breast cancer Sister   . Breast cancer Mother    Social History  Substance Use Topics  . Smoking status: Never Smoker   . Smokeless tobacco: Never Used  . Alcohol Use: No   OB History    No data available     Review of Systems  Unable to perform ROS: Dementia      Allergies  Ivp dye  Home Medications   Prior to Admission medications   Medication Sig Start Date End Date Taking? Authorizing Provider  acetaminophen (TYLENOL) 325 MG tablet Take 650 mg by mouth 2 (two) times daily.    Historical Provider, MD  divalproex (DEPAKOTE SPRINKLE) 125 MG capsule Take 250 mg by mouth at bedtime.     Historical Provider, MD  loperamide (IMODIUM) 2 MG capsule Take 2 mg by mouth every 6 (six) hours as needed for diarrhea or loose stools.     Historical Provider, MD    ondansetron (ZOFRAN) 4 MG tablet Take 4 mg by mouth every 6 (six) hours as needed for nausea or vomiting.    Historical Provider, MD  PARoxetine (PAXIL) 10 MG tablet Take 10 mg by mouth daily with breakfast.    Historical Provider, MD  polyethylene glycol (MIRALAX / GLYCOLAX) packet Take 17 g by mouth daily with breakfast.     Historical Provider, MD  Vitamin D, Ergocalciferol, (DRISDOL) 50000 units CAPS capsule Take 50,000 Units by mouth every 30 (thirty) days.    Historical Provider, MD   There were no vitals taken for this visit. Physical Exam  Constitutional: She appears well-developed and well-nourished.  HENT:  Head: Normocephalic. Head is with laceration.    Right Ear: External ear normal.  Left Ear: External ear normal.  Nose: Nose normal. No nasal septal hematoma. No epistaxis.    Dried blood at nares but no obvious bleeding now or septal hematoma  Eyes: Pupils are equal, round, and reactive to light. Right eye exhibits no discharge. Left eye exhibits no discharge.  Neck: Neck supple. No spinous process tenderness and no muscular tenderness present.  Cardiovascular: Normal rate, regular rhythm and normal heart sounds.   Pulmonary/Chest: Effort normal and breath sounds normal.  Abdominal: Soft. There is no tenderness.  Musculoskeletal:       Right hip: She  exhibits normal range of motion and no tenderness.       Left hip: She exhibits normal range of motion and no tenderness.  Neurological: She is alert.  Awakens to voice but does not respond to questions. Equal strength in all 4 extremities but poor effort  Skin: Skin is warm and dry.  Nursing note and vitals reviewed.   ED Course  .Marland KitchenLaceration Repair Date/Time: 10/20/2015 10:54 AM Performed by: Pricilla Loveless Authorized by: Pricilla Loveless Required items: required blood products, implants, devices, and special equipment available Patient identity confirmed: provided demographic data and arm band Body area:  head/neck Location details: nose Laceration length: 2 cm Tendon involvement: none Nerve involvement: none Vascular damage: no Preparation: Patient was prepped and draped in the usual sterile fashion. Irrigation solution: saline Irrigation method: syringe Amount of cleaning: standard Debridement: none Degree of undermining: none Skin closure: glue Technique: simple Approximation: close  .Marland KitchenLaceration Repair Date/Time: 10/20/2015 10:58 AM Performed by: Pricilla Loveless Authorized by: Pricilla Loveless Body area: head/neck Location details: right cheek Laceration length: 1 cm Tendon involvement: none Nerve involvement: none Vascular damage: no Preparation: Patient was prepped and draped in the usual sterile fashion. Irrigation solution: saline Irrigation method: syringe Amount of cleaning: standard Skin closure: glue Technique: simple Approximation: close Approximation difficulty: simple Patient tolerance: Patient tolerated the procedure well with no immediate complications   (including critical care time) Labs Review Labs Reviewed - No data to display  Imaging Review Ct Head Wo Contrast  10/20/2015  CLINICAL DATA:  Pain following fall EXAM: CT HEAD WITHOUT CONTRAST CT MAXILLOFACIAL WITHOUT CONTRAST CT CERVICAL SPINE WITHOUT CONTRAST TECHNIQUE: Multidetector CT imaging of the head, cervical spine, and maxillofacial structures were performed using the standard protocol without intravenous contrast. Multiplanar CT image reconstructions of the cervical spine and maxillofacial structures were also generated. COMPARISON:  CT cervical spine and CT maxillofacial June 01, 2012; CT head and CT cervical spine September 18, 2015 FINDINGS: CT HEAD FINDINGS Moderate diffuse atrophy is stable. There is no intracranial mass, hemorrhage, extra-axial fluid collection, or midline shift. There is patchy small vessel disease in the centra semiovale bilaterally, stable. There is no evidence of acute infarct.  There are no hyperdense vessels. There is mild calcification in the cavernous carotid artery on the right. The bony calvarium appears intact. The mastoid air cells are clear. CT MAXILLOFACIAL FINDINGS There is no appreciable fracture or dislocation. Orbits appear symmetric and normal bilaterally. Incidental note is made of cataract formation on the left laterally. Paranasal sinuses are clear. Ostiomeatal unit complexes are patent bilaterally. There is edema of the nasal turbinates on the right with narrowing of the nares on the right. There is no frank nares obstruction. Nasal septum is essentially midline. There is apparent cerumen in the left and right external auditory canals. Salivary glands appear symmetric bilaterally. No adenopathy. There is carotid artery calcification bilaterally. Visualize pharynx is unremarkable. CT CERVICAL SPINE FINDINGS There is no fracture. There is 3 mm of anterolisthesis of C7 on T1. No new spondylolisthesis is evident. Prevertebral soft tissues and predental space regions are normal. There is moderately severe disc space narrowing at C3-4, C4-5, C5-6, and C6-7. There is slightly milder narrowing at C7-T1. There is facet hypertrophy at most levels bilaterally. There is moderate exit foraminal narrowing due to bony hypertrophy at multiple levels, most notably at C3-4 on the right, at C4-5 bilaterally, at C5-6 bilaterally, and C6-7 on the right. There are foci of carotid artery calcification bilaterally. There is a radiopaque structure  along the right arytenoid cartilage, stable. IMPRESSION: CT head: Moderate diffuse atrophy, stable. Small vessel vascular disease in the periventricular white matter appear stable. No acute infarct evident. No mass, hemorrhage, or extra-axial fluid collection. No fractures evident. CT maxillofacial: No demonstrable fracture or dislocation. No intraorbital lesions or paranasal sinus disease. Ostiomeatal unit complexes are patent. There is edema of the  nasal turbinates on the right with nares narrowing but no frank obstruction. There is carotid artery calcification bilaterally. There is cerumen in each external auditory canal. CT cervical spine: No fracture. Stable mild spondylolisthesis at C7-T1. No new spondylolisthesis. Multilevel arthropathy. Carotid artery calcification bilaterally. Stable radiopaque structure arising in the right arytenoid cartilage, stable compared to multiple prior studies. Electronically Signed   By: Bretta BangWilliam  Woodruff III M.D.   On: 10/20/2015 10:43   I have personally reviewed and evaluated these images and lab results as part of my medical decision-making.   EKG Interpretation None      MDM   Final diagnoses:  Fall, initial encounter  Facial laceration, initial encounter  Laceration of nose, initial encounter    I discussed with the nursing home and they relate that the patient was fidgeting and moving while the staff was trying to put on her pants, causing her to fall. Appears to be mechanical fall which she has been here for before multiple times. Lacerations are already well approximated and were repaired with Dermabond. CTs are unremarkable. Will give tetanus immunization given the lacerations and no obvious recent tetanus immunization. Will send her back to the nursing home.    Pricilla LovelessScott Jeoffrey Eleazer, MD 10/20/15 [phone]

## 2015-10-20 NOTE — ED Notes (Signed)
MD at bedside. EDP [place] PRESENT 

## 2016-06-30 ENCOUNTER — Emergency Department (HOSPITAL_COMMUNITY)
Admission: EM | Admit: 2016-06-30 | Discharge: 2016-06-30 | Disposition: A | Discharge status: Home / Self Care | Payer: Medicare Other | Attending: Emergency Medicine | Admitting: Emergency Medicine

## 2016-06-30 ENCOUNTER — Encounter (HOSPITAL_COMMUNITY): Payer: Self-pay | Admitting: Adult Health

## 2016-06-30 DIAGNOSIS — Y929 Unspecified place or not applicable: Secondary | ICD-10-CM | POA: Diagnosis not present

## 2016-06-30 DIAGNOSIS — Z79899 Other long term (current) drug therapy: Secondary | ICD-10-CM | POA: Diagnosis not present

## 2016-06-30 DIAGNOSIS — R030 Elevated blood-pressure reading, without diagnosis of hypertension: Secondary | ICD-10-CM | POA: Insufficient documentation

## 2016-06-30 DIAGNOSIS — Y999 Unspecified external cause status: Secondary | ICD-10-CM | POA: Diagnosis not present

## 2016-06-30 DIAGNOSIS — Y939 Activity, unspecified: Secondary | ICD-10-CM | POA: Insufficient documentation

## 2016-06-30 DIAGNOSIS — W1830XA Fall on same level, unspecified, initial encounter: Secondary | ICD-10-CM | POA: Insufficient documentation

## 2016-06-30 DIAGNOSIS — F0390 Unspecified dementia without behavioral disturbance: Secondary | ICD-10-CM | POA: Insufficient documentation

## 2016-06-30 DIAGNOSIS — W19XXXA Unspecified fall, initial encounter: Secondary | ICD-10-CM

## 2016-06-30 NOTE — ED Notes (Signed)
PTAR contacted for tx home  

## 2016-06-30 NOTE — Discharge Instructions (Signed)
Please have patient walk with assistance only.

## 2016-06-30 NOTE — ED Notes (Signed)
Attempted report to [facility] senior living-no answer, transferred to multiple extensions without answer x2

## 2016-06-30 NOTE — ED Provider Notes (Signed)
[facility]-EMERGENCY DEPT Provider Note   CSN: [id number] Arrival date & time: 06/30/16  1609     History   Chief Complaint No chief complaint on file. Level V caveat secondary to dementia history obtained from nursing home report and EMS  HPI Susan Leon is a 83 y.o. female.  HPI  This is an 83 year old lady from a memory care unit who is reported to have been found on the floor in another patient's room. EMS gave report to our end. They reported that all staff at the nursing home was new and was unable to give further history. Patient is awake and alert she is not very verbal but gives 1-2 word answers to some questions. She initially stated that her abdomen hurt when asked if anything hurt. We called her daughter Ms. O'Brien. She states that the patient is a DO NOT RESUSCITATE.  Past Medical History:  Diagnosis Date  . Arthritis   . Constipation   . Dementia   . Headache   . Renal disorder     Patient Active Problem List   Diagnosis Date Noted  . Weight loss 05/21/2011  . Chronic cough 05/18/2011  . Dementia 05/18/2011    Past Surgical History:  Procedure Laterality Date  . ABDOMINAL HYSTERECTOMY      OB History    No data available       Home Medications    Prior to Admission medications   Medication Sig Start Date End Date Taking? Authorizing Provider  acetaminophen (TYLENOL) 325 MG tablet Take 650 mg by mouth 2 (two) times daily.    Historical Provider, MD  divalproex (DEPAKOTE SPRINKLE) 125 MG capsule Take 250 mg by mouth at bedtime.     Historical Provider, MD  loperamide (IMODIUM A-D) 2 MG tablet Take 2 mg by mouth as needed for diarrhea or loose stools.    Historical Provider, MD  PARoxetine (PAXIL) 10 MG tablet Take 10 mg by mouth daily.     Historical Provider, MD  polyethylene glycol (MIRALAX / GLYCOLAX) packet Take 17 g by mouth daily.     Historical Provider, MD  Vitamin D, Ergocalciferol, (DRISDOL) 50000 units CAPS capsule Take 50,000 Units by mouth  every 30 (thirty) days.    Historical Provider, MD    Family History Family History  Problem Relation Age of Onset  . Breast cancer Sister   . Breast cancer Mother     Social History Social History  Substance Use Topics  . Smoking status: Never Smoker  . Smokeless tobacco: Never Used  . Alcohol use No     Allergies   Ivp dye [iodinated diagnostic agents]   Review of Systems Review of Systems  Unable to perform ROS: Dementia     Physical Exam Updated Vital Signs BP (!) 133/91 (BP Location: Right Arm)   Pulse 90   Temp 98.2 F (36.8 C) (Oral)   Resp 18   SpO2 98%   Physical Exam  Constitutional: She appears well-developed and well-nourished. No distress.  HENT:  Head: Normocephalic and atraumatic.  Right Ear: External ear normal.  Left Ear: External ear normal.  Nose: Nose normal.  Eyes: Pupils are equal, round, and reactive to light.  Neck: Normal range of motion. Neck supple.  Cardiovascular: Normal rate.   Pulmonary/Chest: Effort normal and breath sounds normal.  Abdominal: Soft.  Musculoskeletal: Normal range of motion.  Patient's skin is clear and no evidence of contusion is noted. Arms and legs fully ranged with no deformities or complaints  of pain. Her arms and legs are somewhat rigid. Cervical, thoracic, and lumbar spine palpated without any tenderness, step-off, or external signs of trauma  Neurological: She is alert.  Patient is alert and awake and responds to [place] verbally with eye tracking.  She is not oriented to person place or time.  Skin: Skin is warm and dry. Capillary refill takes less than 2 seconds.  Nursing note and vitals reviewed.    ED Treatments / Results  Labs (all labs ordered are listed, but only abnormal results are displayed) Labs Reviewed - No data to display  EKG  EKG Interpretation None       Radiology No results found.  Procedures Procedures (including critical care time)  Medications Ordered in ED Medications  - No data to display   Initial Impression / Assessment and Plan / ED Course  I have reviewed the triage vital signs and the nursing notes.  Pertinent labs & imaging results that were available during my care of the patient were reviewed by me and considered in my medical decision making (see chart for details).   I discussed the patient's presentation with daughter. Given the patient's exam does not reveal any signs of trauma, we will ambulate patient. Patient's mental status is at baseline per her daughter. Plan discharge back to facility if able to ambulate. Patient  with shuffling gait here  Final Clinical Impressions(s) / ED Diagnoses   Final diagnoses:  Dementia without behavioral disturbance, unspecified dementia type  Fall, initial encounter  Elevated blood pressure reading    New Prescriptions New Prescriptions   No medications on file     Margarita Grizzle, MD 06/30/16 1637

## 2016-06-30 NOTE — ED Triage Notes (Signed)
Resident at [facility] senior living post unwitnessed fall, found in another patient's room on the floor, unknown last known well. PT is alert, no visible injury, pt able to stand with 2 people assist and shuffle across room. Daughter, Alira Fretwell notified.

## 2016-10-02 ENCOUNTER — Encounter (HOSPITAL_COMMUNITY): Payer: Self-pay | Admitting: *Deleted

## 2016-10-02 ENCOUNTER — Emergency Department (HOSPITAL_COMMUNITY): Payer: Medicare Other

## 2016-10-02 ENCOUNTER — Emergency Department (HOSPITAL_COMMUNITY)
Admission: EM | Admit: 2016-10-02 | Discharge: 2016-10-02 | Disposition: A | Discharge status: Home / Self Care | Payer: Medicare Other | Attending: Emergency Medicine | Admitting: Emergency Medicine

## 2016-10-02 DIAGNOSIS — Y9301 Activity, walking, marching and hiking: Secondary | ICD-10-CM | POA: Insufficient documentation

## 2016-10-02 DIAGNOSIS — Y999 Unspecified external cause status: Secondary | ICD-10-CM | POA: Diagnosis not present

## 2016-10-02 DIAGNOSIS — F0390 Unspecified dementia without behavioral disturbance: Secondary | ICD-10-CM | POA: Insufficient documentation

## 2016-10-02 DIAGNOSIS — W19XXXA Unspecified fall, initial encounter: Secondary | ICD-10-CM

## 2016-10-02 DIAGNOSIS — W01198A Fall on same level from slipping, tripping and stumbling with subsequent striking against other object, initial encounter: Secondary | ICD-10-CM | POA: Insufficient documentation

## 2016-10-02 DIAGNOSIS — S0990XA Unspecified injury of head, initial encounter: Secondary | ICD-10-CM | POA: Diagnosis present

## 2016-10-02 DIAGNOSIS — Y92129 Unspecified place in nursing home as the place of occurrence of the external cause: Secondary | ICD-10-CM | POA: Insufficient documentation

## 2016-10-02 DIAGNOSIS — Z79899 Other long term (current) drug therapy: Secondary | ICD-10-CM | POA: Insufficient documentation

## 2016-10-02 NOTE — ED Triage Notes (Addendum)
Per EMS pt from [facility] with a c/o witnessed fall. Per EMS pt has dementia and is very unsteady, NH staff reported pt tried to get up and fell hitting left side of head on the baseboard, some swelling noted. No LOC, not on blood thinners. Per EMS NH staff reports pt is nonverbal and not able to follow commands per her baseline.

## 2016-10-02 NOTE — ED Notes (Signed)
Bed: [facility] Expected date:  Expected time:  Means of arrival:  Comments: EMS-fall/dementia 

## 2016-10-02 NOTE — ED Provider Notes (Signed)
[facility]-EMERGENCY DEPT Provider Note   CSN: [id number] Arrival date & time: 10/02/16  1027     History   Chief Complaint Chief Complaint  Patient presents with  . Fall  . Dementia    HPI Susan Leon is a 83 y.o. female.  HPI  83 year old Caucasian female past medical history significant for dementia that presents to the ED by EMS from nursing facility for witnessed mechanical fall. The patient with history of dementia and is very unsteady at baseline. Talked to nursing staff at the facility who states that patient tripped and and fell striking the left side of her head on the baseboard. Denies any use of blood thinners. This to the patient is at her baseline. Daughter is at bedside in the ED today. States that her mom is nonverbal and does not follow commands at baseline. She states that she seems to be at her baseline. Denies any other associated complaints.  There is a Level V caveat due to dementia.    Past Medical History:  Diagnosis Date  . Arthritis   . Constipation   . Dementia   . Headache   . Renal disorder     Patient Active Problem List   Diagnosis Date Noted  . Weight loss 05/21/2011  . Chronic cough 05/18/2011  . Dementia 05/18/2011    Past Surgical History:  Procedure Laterality Date  . ABDOMINAL HYSTERECTOMY      OB History    No data available       Home Medications    Prior to Admission medications   Medication Sig Start Date End Date Taking? Authorizing Provider  acetaminophen (TYLENOL) 325 MG tablet Take 650 mg by mouth 2 (two) times daily.    [provider]  divalproex (DEPAKOTE SPRINKLE) 125 MG capsule Take 250 mg by mouth at bedtime.     [provider]  loperamide (IMODIUM A-D) 2 MG tablet Take 2 mg by mouth as needed for diarrhea or loose stools.    [provider]  Multiple Vitamins-Minerals (ONE-A-DAY WOMENS 50+ ADVANTAGE) TABS Take 1 tablet by mouth daily.    [provider]  PARoxetine (PAXIL) 10  MG tablet Take 10 mg by mouth daily.     [provider]  polyethylene glycol (MIRALAX / GLYCOLAX) packet Take 17 g by mouth daily.     [provider]  Vitamin D, Ergocalciferol, (DRISDOL) 50000 units CAPS capsule Take 50,000 Units by mouth every 30 (thirty) days.    [provider]    Family History Family History  Problem Relation Age of Onset  . Breast cancer Sister   . Breast cancer Mother     Social History Social History  Substance Use Topics  . Smoking status: Never Smoker  . Smokeless tobacco: Never Used  . Alcohol use No     Allergies   Ivp dye [iodinated diagnostic agents]   Review of Systems Review of Systems  Unable to perform ROS: Dementia     Physical Exam Updated Vital Signs BP (!) 134/56 (BP Location: Left Arm)   Pulse 79   Temp 98.7 F (37.1 C) (Oral)   Resp 15   SpO2 99%   Physical Exam  Constitutional: She appears well-developed and well-nourished. No distress.  HENT:  Head: Normocephalic.  Patient was small hematoma to the left parietal bone. No open wound. No bilateral hemotympanum. No septal hematoma. No skull depression.  Eyes: Conjunctivae and EOM are normal. Pupils are equal, round, and reactive to light.  Right eye exhibits no discharge. Left eye exhibits no discharge. No scleral icterus.  Neck:  Patient currently in c-collar. Unable to tell me she has a midline tenderness. No deformity step-offs noted.  Cardiovascular: Normal rate, regular rhythm, normal heart sounds and intact distal pulses.   Pulmonary/Chest: Effort normal and breath sounds normal. No respiratory distress.  No ecchymosis or obvious deformity or step-off noted  Abdominal: Soft. Bowel sounds are normal. There is no tenderness. There is no guarding.  No ecchymosis noted.  Musculoskeletal: Normal range of motion.  Patient able to move all 4 extremities.   Pelvis is stable. Do not appreciate any midline deformity or step-off of L-spine or  T-spine.  Neurological: She is alert.  Difficult to obtain neuro exam due to history of dementia. Patient is nonverbal.  Skin: Skin is warm and dry. Capillary refill takes less than 2 seconds. No pallor.  Psychiatric: Her behavior is normal. Judgment and thought content normal.  Nursing note and vitals reviewed.    ED Treatments / Results  Labs (all labs ordered are listed, but only abnormal results are displayed) Labs Reviewed - No data to display  EKG  EKG Interpretation None       Radiology Ct Head Wo Contrast  Result Date: 10/02/2016 CLINICAL DATA:  "Per EMS pt from [facility] with a c/o witnessed fall. Per EMS pt has dementia and is very unsteady, NH staff reported pt tried to get up and fell hitting left side of head on the baseboard, some swelling noted. No LO.*comment was truncated* EXAM: CT HEAD WITHOUT CONTRAST CT MAXILLOFACIAL WITHOUT CONTRAST CT CERVICAL SPINE WITHOUT CONTRAST TECHNIQUE: Multidetector CT imaging of the head, cervical spine, and maxillofacial structures were performed using the standard protocol without intravenous contrast. Multiplanar CT image reconstructions of the cervical spine and maxillofacial structures were also generated. COMPARISON:  Head CT 10/20/2015 FINDINGS: CT HEAD FINDINGS Brain: No intracranial hemorrhage. No parenchymal contusion. No midline shift or mass effect. Basilar cisterns are patent. No skull base fracture. No fluid in the paranasal sinuses or mastoid air cells. Orbits are normal. Encephalomalacia in the LEFT middle cranial fossa. Extensive atrophy and periventricular white matter hypodensities. Mild ventriculomegaly. These chronic findings are not changed from CT 10/20/2015. Vascular: No hyperdense vessel or unexpected calcification. Skull: Normal. Negative for fracture or focal lesion. Sinuses/Orbits: Paranasal sinuses and mastoid air cells are clear. Orbits are clear. Other: None. CT MAXILLOFACIAL FINDINGS Osseous: Orbital  walls are intact. Nasal bone is normal. Maxillary sinus walls are intact. Pterygoid plates are normal. Mandibular condyles are located. No mandibular ramus fracture. Zygomatic arches are normal. Orbits: Intraconal contents are clear. Globes are normal. No proptosis. Sinuses: No fluid in the sinuses Soft tissues: No soft tissue injury CT CERVICAL SPINE FINDINGS Alignment: Normal alignment of the cervical vertebral bodies. Skull base and vertebrae: Normal craniocervical junction. No loss of vertebral body height or disc height. Normal facet articulation. No evidence of fracture. Soft tissues and spinal canal: No prevertebral soft tissue swelling. No perispinal or epidural hematoma. Disc levels: Mild disc space narrowing at multiple levels and mild osteophytosis. Upper chest: Clear Other: None IMPRESSION: 1. No acute intracranial trauma. 2. Chronic atrophy and white matter microvascular disease. 3. No facial bone fracture. 4. No cervical spine fracture. Electronically Signed   By: Genevive Bi M.D.   On: 10/02/2016 12:47   Ct Cervical Spine Wo Contrast  Result Date: 10/02/2016 CLINICAL DATA:  "Per EMS pt from [facility] with a c/o witnessed fall.  Per EMS pt has dementia and is very unsteady, NH staff reported pt tried to get up and fell hitting left side of head on the baseboard, some swelling noted. No LO.*comment was truncated* EXAM: CT HEAD WITHOUT CONTRAST CT MAXILLOFACIAL WITHOUT CONTRAST CT CERVICAL SPINE WITHOUT CONTRAST TECHNIQUE: Multidetector CT imaging of the head, cervical spine, and maxillofacial structures were performed using the standard protocol without intravenous contrast. Multiplanar CT image reconstructions of the cervical spine and maxillofacial structures were also generated. COMPARISON:  Head CT 10/20/2015 FINDINGS: CT HEAD FINDINGS Brain: No intracranial hemorrhage. No parenchymal contusion. No midline shift or mass effect. Basilar cisterns are patent. No skull base fracture.  No fluid in the paranasal sinuses or mastoid air cells. Orbits are normal. Encephalomalacia in the LEFT middle cranial fossa. Extensive atrophy and periventricular white matter hypodensities. Mild ventriculomegaly. These chronic findings are not changed from CT 10/20/2015. Vascular: No hyperdense vessel or unexpected calcification. Skull: Normal. Negative for fracture or focal lesion. Sinuses/Orbits: Paranasal sinuses and mastoid air cells are clear. Orbits are clear. Other: None. CT MAXILLOFACIAL FINDINGS Osseous: Orbital walls are intact. Nasal bone is normal. Maxillary sinus walls are intact. Pterygoid plates are normal. Mandibular condyles are located. No mandibular ramus fracture. Zygomatic arches are normal. Orbits: Intraconal contents are clear. Globes are normal. No proptosis. Sinuses: No fluid in the sinuses Soft tissues: No soft tissue injury CT CERVICAL SPINE FINDINGS Alignment: Normal alignment of the cervical vertebral bodies. Skull base and vertebrae: Normal craniocervical junction. No loss of vertebral body height or disc height. Normal facet articulation. No evidence of fracture. Soft tissues and spinal canal: No prevertebral soft tissue swelling. No perispinal or epidural hematoma. Disc levels: Mild disc space narrowing at multiple levels and mild osteophytosis. Upper chest: Clear Other: None IMPRESSION: 1. No acute intracranial trauma. 2. Chronic atrophy and white matter microvascular disease. 3. No facial bone fracture. 4. No cervical spine fracture. Electronically Signed   By: Genevive BiStewart  Edmunds M.D.   On: 10/02/2016 12:47   Ct Maxillofacial Wo Cm  Result Date: 10/02/2016 CLINICAL DATA:  "Per EMS pt from [facility] with a c/o witnessed fall. Per EMS pt has dementia and is very unsteady, NH staff reported pt tried to get up and fell hitting left side of head on the baseboard, some swelling noted. No LO.*comment was truncated* EXAM: CT HEAD WITHOUT CONTRAST CT MAXILLOFACIAL WITHOUT  CONTRAST CT CERVICAL SPINE WITHOUT CONTRAST TECHNIQUE: Multidetector CT imaging of the head, cervical spine, and maxillofacial structures were performed using the standard protocol without intravenous contrast. Multiplanar CT image reconstructions of the cervical spine and maxillofacial structures were also generated. COMPARISON:  Head CT 10/20/2015 FINDINGS: CT HEAD FINDINGS Brain: No intracranial hemorrhage. No parenchymal contusion. No midline shift or mass effect. Basilar cisterns are patent. No skull base fracture. No fluid in the paranasal sinuses or mastoid air cells. Orbits are normal. Encephalomalacia in the LEFT middle cranial fossa. Extensive atrophy and periventricular white matter hypodensities. Mild ventriculomegaly. These chronic findings are not changed from CT 10/20/2015. Vascular: No hyperdense vessel or unexpected calcification. Skull: Normal. Negative for fracture or focal lesion. Sinuses/Orbits: Paranasal sinuses and mastoid air cells are clear. Orbits are clear. Other: None. CT MAXILLOFACIAL FINDINGS Osseous: Orbital walls are intact. Nasal bone is normal. Maxillary sinus walls are intact. Pterygoid plates are normal. Mandibular condyles are located. No mandibular ramus fracture. Zygomatic arches are normal. Orbits: Intraconal contents are clear. Globes are normal. No proptosis. Sinuses: No fluid in the sinuses Soft tissues: No soft  tissue injury CT CERVICAL SPINE FINDINGS Alignment: Normal alignment of the cervical vertebral bodies. Skull base and vertebrae: Normal craniocervical junction. No loss of vertebral body height or disc height. Normal facet articulation. No evidence of fracture. Soft tissues and spinal canal: No prevertebral soft tissue swelling. No perispinal or epidural hematoma. Disc levels: Mild disc space narrowing at multiple levels and mild osteophytosis. Upper chest: Clear Other: None IMPRESSION: 1. No acute intracranial trauma. 2. Chronic atrophy and white matter  microvascular disease. 3. No facial bone fracture. 4. No cervical spine fracture. Electronically Signed   By: Genevive Bi M.D.   On: 10/02/2016 12:47    Procedures Procedures (including critical care time)  Medications Ordered in ED Medications - No data to display   Initial Impression / Assessment and Plan / ED Course  I have reviewed the triage vital signs and the nursing notes.  Pertinent labs & imaging results that were available during my care of the patient were reviewed by me and considered in my medical decision making (see chart for details).     Patient with history of dementia and falls presents to the emergency department today with mechanical fall. Spoke with the nursing staff who witnessed the fall. They deny any LOC. The patient is nonverbal and does not follow commands at baseline due to dementia. Nursing staff and daughter at bedside states that she is currently at her baseline. She does have small hematoma to the left side of her head. Imaging was obtained that showed no acute abnormalities. Vital signs are stable. Daughter feels comfortable with discharge back to facility. Patient was discussed with Dr. Jacqulyn Bath who saw patient is agreeable to above plan. All questions answered with daughter prior to discharge. Encouraged follow-up with her primary care doctor and given strict return precautions.  Final Clinical Impressions(s) / ED Diagnoses   Final diagnoses:  Fall, initial encounter  Dementia without behavioral disturbance, unspecified dementia type    New Prescriptions Discharge Medication List as of 10/02/2016  1:11 PM       Rise Mu, PA-C 10/02/16 1747    Long, Arlyss Repress, MD 10/02/16 [phone]

## 2016-10-02 NOTE — Discharge Instructions (Signed)
All of your imaging has been normal. Please make sure you follow up with her primary care doctor. If she has any worsening symptoms make sure she returns to the emergency department. May keep ice on the hematoma to help with swelling. Take Tylenol if she complains of any pain.

## 2016-10-02 NOTE — ED Notes (Signed)
Pt returned from CT °

## 2016-10-02 NOTE — ED Notes (Signed)
Pt's daughter verbalized understanding of discharge instructions.

## 2016-11-24 ENCOUNTER — Emergency Department (HOSPITAL_COMMUNITY)
Admission: EM | Admit: 2016-11-24 | Discharge: 2016-11-24 | Disposition: A | Discharge status: Skilled Nursing Facility | Payer: Medicare Other | Attending: Emergency Medicine | Admitting: Emergency Medicine

## 2016-11-24 ENCOUNTER — Encounter (HOSPITAL_COMMUNITY): Payer: Self-pay | Admitting: Emergency Medicine

## 2016-11-24 DIAGNOSIS — Z79899 Other long term (current) drug therapy: Secondary | ICD-10-CM | POA: Insufficient documentation

## 2016-11-24 DIAGNOSIS — Z043 Encounter for examination and observation following other accident: Secondary | ICD-10-CM | POA: Insufficient documentation

## 2016-11-24 DIAGNOSIS — W19XXXA Unspecified fall, initial encounter: Secondary | ICD-10-CM | POA: Diagnosis not present

## 2016-11-24 DIAGNOSIS — Y939 Activity, unspecified: Secondary | ICD-10-CM | POA: Insufficient documentation

## 2016-11-24 DIAGNOSIS — Y999 Unspecified external cause status: Secondary | ICD-10-CM | POA: Diagnosis not present

## 2016-11-24 DIAGNOSIS — F0390 Unspecified dementia without behavioral disturbance: Secondary | ICD-10-CM | POA: Diagnosis not present

## 2016-11-24 DIAGNOSIS — Y92122 Bedroom in nursing home as the place of occurrence of the external cause: Secondary | ICD-10-CM | POA: Insufficient documentation

## 2016-11-24 NOTE — ED Notes (Signed)
Called report to [facility] facility. Facility aware that pt will be returning.

## 2016-11-24 NOTE — ED Notes (Signed)
ED Provider at bedside. 

## 2016-11-24 NOTE — ED Provider Notes (Signed)
[facility]-EMERGENCY DEPT Provider Note   CSN: [id number] Arrival date & time: 11/24/16  1433     History   Chief Complaint Chief Complaint  Patient presents with  . Fall   Triage Note 1516 Pt presents via EMS from [facility] memory care unit. When staff arrived, she was between her bed and nightstand on the floor. Pt is not c/o any pain but they sent her for evaluation for rule out injury. On palpation by EMS, no deformities noted.     HPI Susan Leon is a 83 y.o. female.  HPI  Remainder of history, ROS, and physical exam limited due to patient's condition (dementia). Additional information was obtained from EMS.   Level V Caveat.   Past Medical History:  Diagnosis Date  . Arthritis   . Constipation   . Dementia   . Headache   . Renal disorder     Patient Active Problem List   Diagnosis Date Noted  . Weight loss 05/21/2011  . Chronic cough 05/18/2011  . Dementia 05/18/2011    Past Surgical History:  Procedure Laterality Date  . ABDOMINAL HYSTERECTOMY      OB History    No data available       Home Medications    Prior to Admission medications   Medication Sig Start Date End Date Taking? Authorizing Provider  acetaminophen (TYLENOL) 325 MG tablet Take 650 mg by mouth 2 (two) times daily.    [provider]  divalproex (DEPAKOTE SPRINKLE) 125 MG capsule Take 250 mg by mouth at bedtime.     [provider]  loperamide (IMODIUM A-D) 2 MG tablet Take 2 mg by mouth as needed for diarrhea or loose stools.    [provider]  Multiple Vitamins-Minerals (ONE-A-DAY WOMENS 50+ ADVANTAGE) TABS Take 1 tablet by mouth daily.    [provider]  PARoxetine (PAXIL) 10 MG tablet Take 10 mg by mouth daily.     [provider]  polyethylene glycol (MIRALAX / GLYCOLAX) packet Take 17 g by mouth daily.     [provider]  Vitamin D, Ergocalciferol, (DRISDOL) 50000 units CAPS capsule Take 50,000 Units by mouth every 30  (thirty) days.    [provider]    Family History Family History  Problem Relation Age of Onset  . Breast cancer Sister   . Breast cancer Mother     Social History Social History  Substance Use Topics  . Smoking status: Never Smoker  . Smokeless tobacco: Never Used  . Alcohol use No     Allergies   Ivp dye [iodinated diagnostic agents]   Review of Systems Review of Systems  Unable to perform ROS: Dementia     Physical Exam Updated Vital Signs BP 132/66 (BP Location: Left Arm)   Pulse 76   Temp 98.2 F (36.8 C) (Oral)   Resp 18   SpO2 94%   Physical Exam  Constitutional: She appears well-developed and well-nourished. No distress.  HENT:  Head: Normocephalic and atraumatic.  Right Ear: External ear normal.  Left Ear: External ear normal.  Nose: Nose normal.  Eyes: Pupils are equal, round, and reactive to light. Conjunctivae and EOM are normal. Right eye exhibits no discharge. Left eye exhibits no discharge. No scleral icterus.  Neck: Normal range of motion. Neck supple.  Cardiovascular: Normal rate, regular rhythm and normal heart sounds.  Exam reveals no gallop and no friction rub.   No murmur heard. Pulses:      Radial pulses are  2+ on the right side, and 2+ on the left side.       Dorsalis pedis pulses are 2+ on the right side, and 2+ on the left side.  Pulmonary/Chest: Effort normal and breath sounds normal. No stridor. No respiratory distress. She has no wheezes.  Abdominal: Soft. She exhibits no distension. There is no tenderness.  Musculoskeletal: She exhibits no edema or tenderness.       Cervical back: She exhibits no bony tenderness.       Thoracic back: She exhibits no bony tenderness.       Lumbar back: She exhibits no bony tenderness.  Clavicles stable. Chest stable to AP/Lat compression. Pelvis stable to Lat compression. No obvious extremity deformity. No chest or abdominal wall contusion.  Neurological: She is alert.   Nonverbal. Moving all extremities  Skin: Skin is warm and dry. Bruising noted. No rash noted. She is not diaphoretic. No erythema.     Psychiatric: She has a normal mood and affect.     ED Treatments / Results  Labs (all labs ordered are listed, but only abnormal results are displayed) Labs Reviewed - No data to display  EKG  EKG Interpretation None       Radiology No results found.  Procedures Procedures (including critical care time)  Medications Ordered in ED Medications - No data to display   Initial Impression / Assessment and Plan / ED Course  I have reviewed the triage vital signs and the nursing notes.  Pertinent labs & imaging results that were available during my care of the patient were reviewed by me and considered in my medical decision making (see chart for details).     Appears to have been a low mechanism injury. No anticoagulation noted on record. No evidence of Serious external injuries on exam. Did not feel that advanced imaging is warranted at this time.  The patient is safe for discharge with strict return precautions back to her skilled nursing facility.   Final Clinical Impressions(s) / ED Diagnoses   Final diagnoses:  Fall, initial encounter   Disposition: Discharge  Condition: Good   Follow Up: Florentina Jenny, MD [phone] [place] [address] [phone]  Schedule an appointment as soon as possible for a visit  As needed      Terria Deschepper, Amadeo Garnet, MD 11/24/16 [phone]

## 2016-11-24 NOTE — ED Triage Notes (Signed)
Pt presents via EMS from [facility] memory care unit. When staff arrived, she was between her bed and nightstand on the floor. Pt is not c/o any pain but they sent her for evaluation for rule out injury. On palpation by EMS, no deformities noted.

## 2016-11-24 NOTE — ED Notes (Signed)
Bed: WO03 Expected date:  Expected time:  Means of arrival:  Comments: 83 yo fall; eval

## 2017-06-23 ENCOUNTER — Emergency Department (HOSPITAL_COMMUNITY)

## 2017-06-23 ENCOUNTER — Emergency Department (HOSPITAL_COMMUNITY)
Admission: EM | Admit: 2017-06-23 | Discharge: 2017-06-24 | Disposition: A | Discharge status: Home / Self Care | Attending: Emergency Medicine | Admitting: Emergency Medicine

## 2017-06-23 ENCOUNTER — Encounter (HOSPITAL_COMMUNITY): Payer: Self-pay

## 2017-06-23 DIAGNOSIS — Z79899 Other long term (current) drug therapy: Secondary | ICD-10-CM | POA: Diagnosis not present

## 2017-06-23 DIAGNOSIS — S0001XA Abrasion of scalp, initial encounter: Secondary | ICD-10-CM | POA: Diagnosis not present

## 2017-06-23 DIAGNOSIS — Y9389 Activity, other specified: Secondary | ICD-10-CM | POA: Insufficient documentation

## 2017-06-23 DIAGNOSIS — W050XXA Fall from non-moving wheelchair, initial encounter: Secondary | ICD-10-CM | POA: Diagnosis not present

## 2017-06-23 DIAGNOSIS — W19XXXA Unspecified fall, initial encounter: Secondary | ICD-10-CM

## 2017-06-23 DIAGNOSIS — F0390 Unspecified dementia without behavioral disturbance: Secondary | ICD-10-CM | POA: Diagnosis not present

## 2017-06-23 DIAGNOSIS — Y92129 Unspecified place in nursing home as the place of occurrence of the external cause: Secondary | ICD-10-CM | POA: Insufficient documentation

## 2017-06-23 DIAGNOSIS — Y998 Other external cause status: Secondary | ICD-10-CM | POA: Insufficient documentation

## 2017-06-23 DIAGNOSIS — S0003XA Contusion of scalp, initial encounter: Secondary | ICD-10-CM | POA: Diagnosis not present

## 2017-06-23 DIAGNOSIS — S0990XA Unspecified injury of head, initial encounter: Secondary | ICD-10-CM | POA: Diagnosis present

## 2017-06-23 DIAGNOSIS — T148XXA Other injury of unspecified body region, initial encounter: Secondary | ICD-10-CM

## 2017-06-23 HISTORY — DX: Unspecified osteoarthritis, unspecified site: M19.90

## 2017-06-23 HISTORY — DX: Vitamin D deficiency, unspecified: E55.9

## 2017-06-23 MED ORDER — BACITRACIN ZINC 500 UNIT/GM EX OINT
TOPICAL_OINTMENT | Freq: Once | CUTANEOUS | Status: AC
  Administered 2017-06-23: 1 via TOPICAL
  Filled 2017-06-23: qty 0.9

## 2017-06-23 NOTE — ED Provider Notes (Signed)
Medical screening examination/treatment/procedure(s) were conducted as a shared visit with non-physician practitioner(s) and myself.  I personally evaluated the patient during the encounter.   EKG Interpretation None      84 year old female here after having a fall.  Does have history of dementia.  CT of the head and C-spine without acute findings.  She is at her baseline according to family.  She is stable for discharge   Lorre NickAllen, Coryn Mosso, MD 06/23/17 2119

## 2017-06-23 NOTE — Discharge Instructions (Signed)
Follow-up with your primary care doctor as needed.  Return to the emergency department for any worsening pain, bleeding, fever, any other worsening or concerning symptoms.

## 2017-06-23 NOTE — ED Notes (Signed)
[facility] Expected date:  Expected time:  Means of arrival:  Comments: 87 f fall

## 2017-06-23 NOTE — ED Triage Notes (Signed)
Pt arrived via [facility] from [facility] due to witnessed fall. Pt has hx of dementia, nonverbal per baseline. Facility reports hitting head, small laceration on back of head. No LOC, not on blood thinners.

## 2017-06-23 NOTE — ED Provider Notes (Signed)
[facility]-EMERGENCY DEPT Provider Note   CSN: [id number] Arrival date & time: 06/23/17  1850     History   Chief Complaint Chief Complaint  Patient presents with  . Fall    HPI Susan Leon is a 84 y.o. female past medical history of dementia brought in by EMS from [facility] who presents after a mechanical fall.  Daughter reports that patient was sitting in her wheelchair and had a witnessed fall where she fell.  Facility reports that she hit the back of her head.  No LOC.  Patient is not on blood thinners.  EM LEVEL 5 CAVEAT  The history is provided by the EMS personnel.    Past Medical History:  Diagnosis Date  . Arthritis   . Constipation   . Dementia   . Headache   . Osteoarthritis   . Renal disorder   . Vitamin D deficiency     Patient Active Problem List   Diagnosis Date Noted  . Weight loss 05/21/2011  . Chronic cough 05/18/2011  . Dementia 05/18/2011    Past Surgical History:  Procedure Laterality Date  . ABDOMINAL HYSTERECTOMY       OB History   None      Home Medications    Prior to Admission medications   Medication Sig Start Date End Date Taking? Authorizing Provider  acetaminophen (TYLENOL) 325 MG tablet Take 650 mg by mouth 2 (two) times daily.    [provider]  divalproex (DEPAKOTE SPRINKLE) 125 MG capsule Take 250 mg by mouth at bedtime.     [provider]  loperamide (IMODIUM A-D) 2 MG tablet Take 2 mg by mouth as needed for diarrhea or loose stools.    [provider]  Multiple Vitamins-Minerals (ONE-A-DAY WOMENS 50+ ADVANTAGE) TABS Take 1 tablet by mouth daily.    [provider]  PARoxetine (PAXIL) 10 MG tablet Take 10 mg by mouth daily.     [provider]  polyethylene glycol (MIRALAX / GLYCOLAX) packet Take 17 g by mouth daily.     [provider]  Vitamin D, Ergocalciferol, (DRISDOL) 50000 units CAPS capsule Take 50,000 Units by mouth every 30 (thirty)  days.    [provider]    Family History Family History  Problem Relation Age of Onset  . Breast cancer Sister   . Breast cancer Mother     Social History Social History   Tobacco Use  . Smoking status: Never Smoker  . Smokeless tobacco: Never Used  Substance Use Topics  . Alcohol use: No  . Drug use: No     Allergies   Ivp dye [iodinated diagnostic agents]   Review of Systems Review of Systems  Unable to perform ROS: Dementia     Physical Exam Updated Vital Signs BP (!) 122/53 (BP Location: Right Arm)   Pulse 84   Temp 98.5 F (36.9 C) (Axillary)   Resp 16   SpO2 97%   Physical Exam  Constitutional: She appears well-developed and well-nourished.  HENT:  Head: Normocephalic and atraumatic.    Eyes: Conjunctivae and EOM are normal. Right eye exhibits no discharge. Left eye exhibits no discharge. No scleral icterus.  PERRL  Neck: Normal range of motion. No neck rigidity.  Pulmonary/Chest: Effort normal.  Abdominal: Soft. Normal appearance.  Abdomen is soft, non-distended.   Neurological: She is alert. She displays atrophy.  Unable to assess neuro status as patient cannot follow commands  Skin: Skin is warm and dry.  Psychiatric: She has a normal mood and affect. Her speech is normal and behavior is normal.  Nursing note and vitals reviewed.    ED Treatments / Results  Labs (all labs ordered are listed, but only abnormal results are displayed) Labs Reviewed - No data to display  EKG None  Radiology Ct Head Wo Contrast  Result Date: 06/23/2017 CLINICAL DATA:  84 year old female with witnessed fall. Head injury with posterior head laceration. Negative loss of consciousness. Headache. EXAM: CT HEAD WITHOUT CONTRAST CT CERVICAL SPINE WITHOUT CONTRAST TECHNIQUE: Multidetector CT imaging of the head and cervical spine was performed following the standard protocol without intravenous contrast. Multiplanar CT image reconstructions of the  cervical spine were also generated. COMPARISON:  Head and cervical spine CT 10/02/2016. FINDINGS: CT HEAD FINDINGS Brain: Stable cerebral volume. Stable ventriculomegaly, lateral and 3rd ventricle enlargement with relative sparing of the 4th ventricle. Stable Patchy and confluent bilateral cerebral white matter hypodensity. Chronic bilateral temporal lobe volume loss appears stable. Small chronic lacunar infarct of the left caudate nucleus appears stable. No midline shift, mass effect, evidence of mass lesion, intracranial hemorrhage or evidence of cortically based acute infarction. Vascular: Mild Calcified atherosclerosis at the skull base. No suspicious intracranial vascular hyperdensity. Skull: Stable and intact. Sinuses/Orbits: Minimal right sphenoid sinus mucosal thickening is new. Otherwise paranasal sinuses and mastoids are stable and well pneumatized. Other: Orbits soft tissues are stable. No scalp hematoma identified.  No subcutaneous gas identified. CT CERVICAL SPINE FINDINGS Alignment: Stable since 2018 with chronic anterolisthesis of C7 on T1, subtle anterolisthesis of C2 on C3 and C3 on C4. Bilateral posterior element alignment is within normal limits. Skull base and vertebrae: Visualized skull base is intact. No atlanto-occipital dissociation. No cervical spine fracture identified. Soft tissues and spinal canal: No prevertebral fluid or swelling. No visible canal hematoma. Disc levels:  Chronic cervical spine degeneration appears stable. Upper chest: Osteopenia. The visible upper thoracic levels appear grossly intact, with stable mild chronic inferior endplate deformity of T3. Negative lung apices. Retained secretions in the trachea at the thoracic inlet. Calcified aortic atherosclerosis. IMPRESSION: 1. Stable non contrast CT appearance of the brain. Chronic temporal lobe atrophy, ventriculomegaly, white matter changes. 2. No acute traumatic injury identified in the head or cervical spine. 3. Small  volume retained or aspirated secretions in the trachea at the thoracic inlet. 4.  Aortic Atherosclerosis (ICD10-I70.0). Electronically Signed   By: Odessa Fleming M.D.   On: 06/23/2017 20:57   Ct Cervical Spine Wo Contrast  Result Date: 06/23/2017 CLINICAL DATA:  84 year old female with witnessed fall. Head injury with posterior head laceration. Negative loss of consciousness. Headache. EXAM: CT HEAD WITHOUT CONTRAST CT CERVICAL SPINE WITHOUT CONTRAST TECHNIQUE: Multidetector CT imaging of the head and cervical spine was performed following the standard protocol without intravenous contrast. Multiplanar CT image reconstructions of the cervical spine were also generated. COMPARISON:  Head and cervical spine CT 10/02/2016. FINDINGS: CT HEAD FINDINGS Brain: Stable cerebral volume. Stable ventriculomegaly, lateral and 3rd ventricle enlargement with relative sparing of the 4th ventricle. Stable Patchy and confluent bilateral cerebral white matter hypodensity. Chronic bilateral temporal lobe volume loss appears stable. Small chronic lacunar infarct of the left caudate nucleus appears stable. No midline shift, mass effect, evidence of mass lesion, intracranial hemorrhage or evidence of cortically based acute infarction. Vascular: Mild Calcified atherosclerosis at the skull base. No suspicious intracranial vascular hyperdensity. Skull: Stable and intact. Sinuses/Orbits: Minimal right sphenoid sinus mucosal thickening is new. Otherwise paranasal sinuses and mastoids are stable  and well pneumatized. Other: Orbits soft tissues are stable. No scalp hematoma identified.  No subcutaneous gas identified. CT CERVICAL SPINE FINDINGS Alignment: Stable since 2018 with chronic anterolisthesis of C7 on T1, subtle anterolisthesis of C2 on C3 and C3 on C4. Bilateral posterior element alignment is within normal limits. Skull base and vertebrae: Visualized skull base is intact. No atlanto-occipital dissociation. No cervical spine fracture  identified. Soft tissues and spinal canal: No prevertebral fluid or swelling. No visible canal hematoma. Disc levels:  Chronic cervical spine degeneration appears stable. Upper chest: Osteopenia. The visible upper thoracic levels appear grossly intact, with stable mild chronic inferior endplate deformity of T3. Negative lung apices. Retained secretions in the trachea at the thoracic inlet. Calcified aortic atherosclerosis. IMPRESSION: 1. Stable non contrast CT appearance of the brain. Chronic temporal lobe atrophy, ventriculomegaly, white matter changes. 2. No acute traumatic injury identified in the head or cervical spine. 3. Small volume retained or aspirated secretions in the trachea at the thoracic inlet. 4.  Aortic Atherosclerosis (ICD10-I70.0). Electronically Signed   By: Odessa Fleming M.D.   On: 06/23/2017 20:57    Procedures Procedures (including critical care time)  Medications Ordered in ED Medications  bacitracin ointment (has no administration in time range)     Initial Impression / Assessment and Plan / ED Course  I have reviewed the triage vital signs and the nursing notes.  Pertinent labs & imaging results that were available during my care of the patient were reviewed by me and considered in my medical decision making (see chart for details).     84 year old female with past history of dementia who presents from [facility] after mechanical fall.  Daughter reports that fall was witnessed from wheelchair.  Patient had the back of her head.  No LOC.  Patient is on blood thinners.  No vomiting.  Patient comes into the ED with a applied c-collar from EMS.  Patient has some small bleeding and hematoma noted to the right posterior head.  No other acute abnormalities.  CT head and neck negative for any acute abnormalities.  Discussed results with daughter.  Wound care applied to the posterior right head.  No underlying laceration that would require staples or sutures here in the  ED.  Patient does have a small abrasion that was easily cleaned off and bacitracin applied to the wound.  Patient stable for discharge at this time.  Final Clinical Impressions(s) / ED Diagnoses   Final diagnoses:  Fall, initial encounter  Abrasion  Hematoma    ED Discharge Orders    None       Maxwell Caul, PA-C 06/24/17 1308

## 2017-07-31 IMAGING — CT CT CERVICAL SPINE W/O CM
3 of 11 series · 7 of 33 positions shown, 8 images · non-contrast
Comparison: CT cervical spine and CT maxillofacial June 01, 2012;
CT head and CT cervical spine September 18, 2015

CLINICAL DATA: Pain following fall

EXAM:
CT HEAD WITHOUT CONTRAST
CT MAXILLOFACIAL WITHOUT CONTRAST
CT CERVICAL SPINE WITHOUT CONTRAST
TECHNIQUE: Multidetector CT imaging of the head, cervical spine, and
maxillofacial structures were performed using the standard protocol
without intravenous contrast. Multiplanar CT image reconstructions
of the cervical spine and maxillofacial structures were also
generated.

[Series 7: c-spine st · axial · 0.25mm/px · z∈[-216,-160]mm · 2 of 85 slices shown, 3 images]
[im 29/85  soft-tissue]
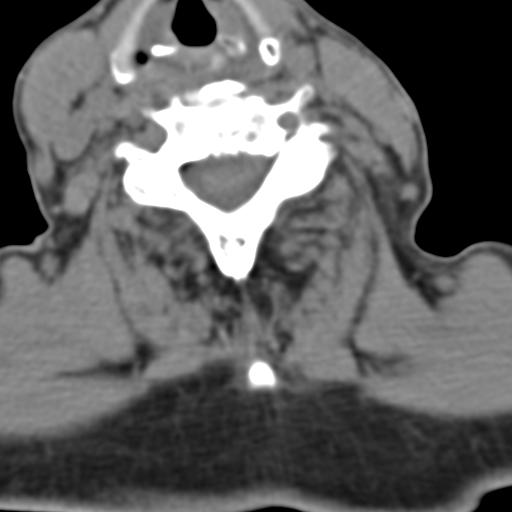
[im 29/85  bone]
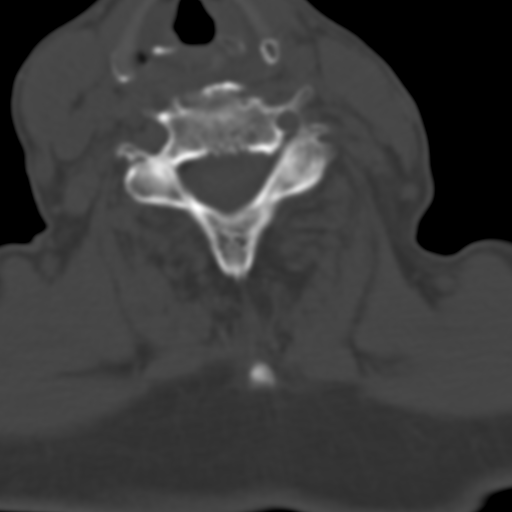
[im 57/85  bone]
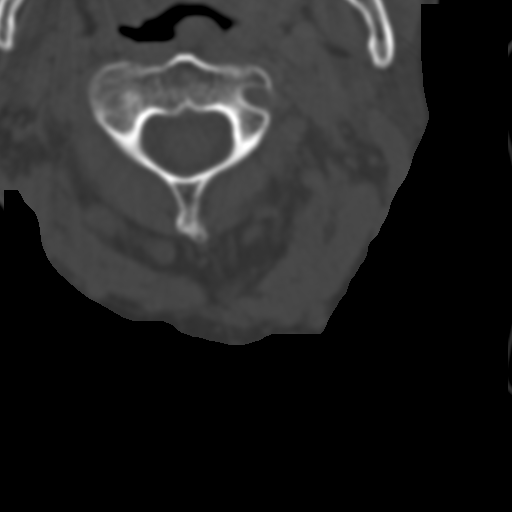

[Series 14: sagittal st · sagittal · 0.27mm/px · 3 of 82 slices shown]
[im 21/82  bone]
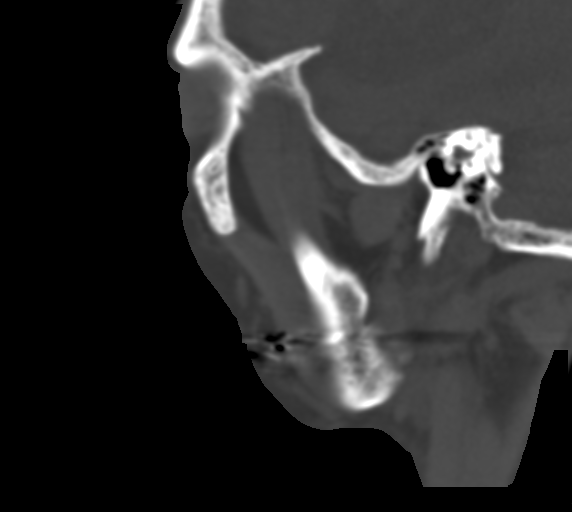
[im 41/82  bone]
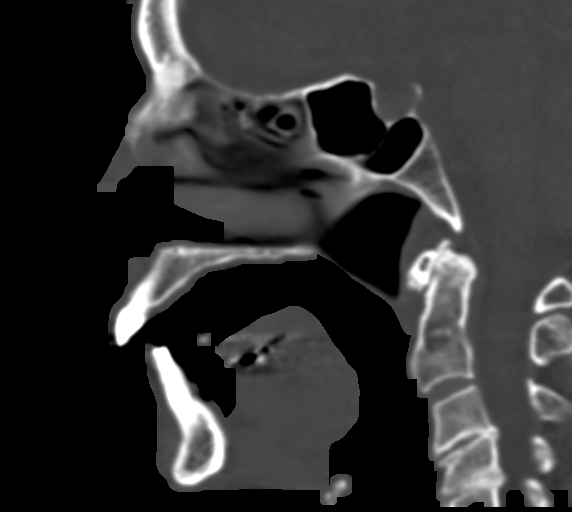
[im 61/82  bone]
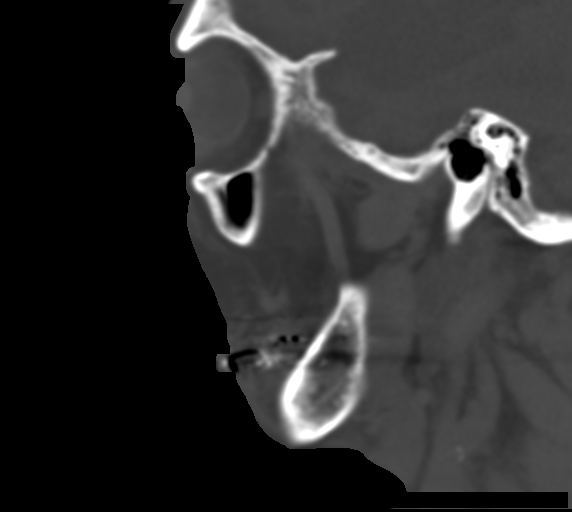

[Series 19: axial · axial · 0.22mm/px · z∈[-230,-177]mm · 2 of 83 slices shown]
[im 28/83  bone]
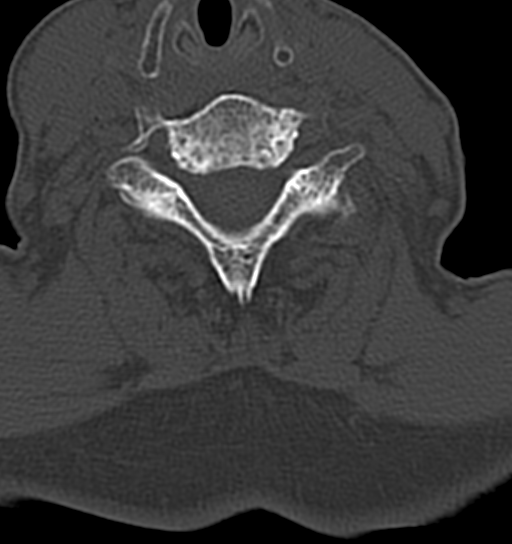
[im 55/83  bone]
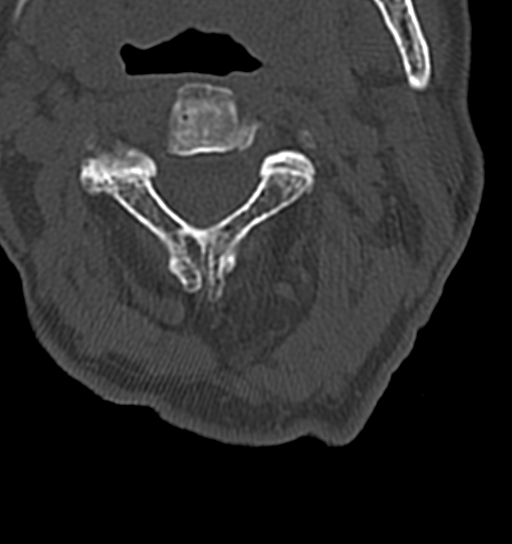

[7 of 33 positions shown; findings below may reference images not displayed]

FINDINGS: CT HEAD FINDINGS

Moderate diffuse atrophy is stable. There is no intracranial mass,
hemorrhage, extra-axial fluid collection, or midline shift. There is
patchy small vessel disease in the centra semiovale bilaterally,
stable. There is no evidence of acute infarct. There are no
hyperdense vessels. There is mild calcification in the cavernous
carotid artery on the right. The bony calvarium appears intact. The
mastoid air cells are clear.

CT MAXILLOFACIAL FINDINGS

There is no appreciable fracture or dislocation. Orbits appear
symmetric and normal bilaterally. Incidental note is made of
cataract formation on the left laterally. Paranasal sinuses are
clear. Ostiomeatal unit complexes are patent bilaterally. There is
edema of the nasal turbinates on the right with narrowing of the
nares on the right. There is no frank nares obstruction. Nasal
septum is essentially midline. There is apparent cerumen in the left
and right external auditory canals. Salivary glands appear symmetric
bilaterally. No adenopathy. There is carotid artery calcification
bilaterally. Visualize pharynx is unremarkable.

CT CERVICAL SPINE FINDINGS

There is no fracture. There is 3 mm of anterolisthesis of C7 on T1.
No new spondylolisthesis is evident. Prevertebral soft tissues and
predental space regions are normal. There is moderately severe disc
space narrowing at C3-4, C4-5, C5-6, and C6-7. There is slightly
milder narrowing at C7-T1. There is facet hypertrophy at most levels
bilaterally. There is moderate exit foraminal narrowing due to bony
hypertrophy at multiple levels, most notably at C3-4 on the right,
at C4-5 bilaterally, at C5-6 bilaterally, and C6-7 on the right.
There are foci of carotid artery calcification bilaterally. There is
a radiopaque structure along the right arytenoid cartilage, stable.
IMPRESSION: CT head: Moderate diffuse atrophy, stable. Small vessel vascular
disease in the periventricular white matter appear stable. No acute
infarct evident. No mass, hemorrhage, or extra-axial fluid
collection. No fractures evident.

CT maxillofacial: No demonstrable fracture or dislocation. No
intraorbital lesions or paranasal sinus disease. Ostiomeatal unit
complexes are patent. There is edema of the nasal turbinates on the
right with nares narrowing but no frank obstruction. There is
carotid artery calcification bilaterally. There is cerumen in each
external auditory canal.

CT cervical spine: No fracture. Stable mild spondylolisthesis at
C7-T1. No new spondylolisthesis. Multilevel arthropathy. Carotid
artery calcification bilaterally. Stable radiopaque structure
arising in the right arytenoid cartilage, stable compared to
multiple prior studies.
